# Patient Record
Sex: Male | Born: 1965 | Race: White | Hispanic: No | Marital: Married | Smoking: Never smoker
Health system: Southern US, Community
[De-identification: ages and names within clinical notes are randomized; demographics above are authoritative.]

## PROBLEM LIST (undated history)

## (undated) DIAGNOSIS — G4733 Obstructive sleep apnea (adult) (pediatric): Secondary | ICD-10-CM

## (undated) DIAGNOSIS — R748 Abnormal levels of other serum enzymes: Secondary | ICD-10-CM

## (undated) DIAGNOSIS — K219 Gastro-esophageal reflux disease without esophagitis: Secondary | ICD-10-CM

## (undated) DIAGNOSIS — K654 Sclerosing mesenteritis: Secondary | ICD-10-CM

## (undated) DIAGNOSIS — I2510 Atherosclerotic heart disease of native coronary artery without angina pectoris: Secondary | ICD-10-CM

## (undated) DIAGNOSIS — E291 Testicular hypofunction: Secondary | ICD-10-CM

## (undated) DIAGNOSIS — R079 Chest pain, unspecified: Secondary | ICD-10-CM

## (undated) DIAGNOSIS — E119 Type 2 diabetes mellitus without complications: Secondary | ICD-10-CM

## (undated) DIAGNOSIS — Z9181 History of falling: Secondary | ICD-10-CM

## (undated) DIAGNOSIS — I219 Acute myocardial infarction, unspecified: Secondary | ICD-10-CM

## (undated) DIAGNOSIS — F1011 Alcohol abuse, in remission: Secondary | ICD-10-CM

## (undated) DIAGNOSIS — Z87898 Personal history of other specified conditions: Secondary | ICD-10-CM

## (undated) HISTORY — PX: ANAL FISSURECTOMY: SUR608

## (undated) HISTORY — PX: PILONIDAL CYST / SINUS EXCISION: SUR543

## (undated) HISTORY — PX: APPENDECTOMY: SHX54

---

## 2000-10-17 DIAGNOSIS — Z87898 Personal history of other specified conditions: Secondary | ICD-10-CM

## 2000-10-17 DIAGNOSIS — I219 Acute myocardial infarction, unspecified: Secondary | ICD-10-CM

## 2000-10-17 HISTORY — DX: Acute myocardial infarction, unspecified: I21.9

## 2000-10-17 HISTORY — DX: Personal history of other specified conditions: Z87.898

## 2009-12-15 DIAGNOSIS — Z9181 History of falling: Secondary | ICD-10-CM

## 2009-12-15 HISTORY — DX: History of falling: Z91.81

## 2010-07-16 ENCOUNTER — Ambulatory Visit: Payer: Self-pay | Admitting: Internal Medicine

## 2010-07-27 ENCOUNTER — Ambulatory Visit: Payer: Self-pay | Admitting: Internal Medicine

## 2011-08-18 ENCOUNTER — Ambulatory Visit: Payer: Self-pay | Admitting: Sports Medicine

## 2013-07-29 ENCOUNTER — Emergency Department: Payer: Self-pay | Admitting: Emergency Medicine

## 2013-07-29 LAB — BASIC METABOLIC PANEL
BUN: 13 mg/dL (ref 7–18)
Calcium, Total: 8.8 mg/dL (ref 8.5–10.1)
Chloride: 105 mmol/L (ref 98–107)
Co2: 24 mmol/L (ref 21–32)
Creatinine: 0.78 mg/dL (ref 0.60–1.30)
EGFR (African American): >60
EGFR (Non-African Amer.): >60
Potassium: 3.7 mmol/L (ref 3.5–5.1)

## 2013-07-29 LAB — CBC
HCT: 42.5 % (ref 40.0–52.0)
HGB: 15 g/dL (ref 13.0–18.0)
MCHC: 35.3 g/dL (ref 32.0–36.0)
MCV: 83 fL (ref 80–100)
RBC: 5.14 10*6/uL (ref 4.40–5.90)
RDW: 13.5 % (ref 11.5–14.5)
WBC: 9.5 10*3/uL (ref 3.8–10.6)

## 2013-07-30 ENCOUNTER — Other Ambulatory Visit: Payer: Self-pay

## 2014-08-05 DIAGNOSIS — E119 Type 2 diabetes mellitus without complications: Secondary | ICD-10-CM | POA: Insufficient documentation

## 2014-08-05 DIAGNOSIS — E291 Testicular hypofunction: Secondary | ICD-10-CM | POA: Insufficient documentation

## 2014-08-05 HISTORY — DX: Testicular hypofunction: E29.1

## 2015-02-15 DIAGNOSIS — R079 Chest pain, unspecified: Secondary | ICD-10-CM

## 2015-02-15 HISTORY — DX: Chest pain, unspecified: R07.9

## 2015-02-28 ENCOUNTER — Encounter: Payer: Self-pay | Admitting: *Deleted

## 2015-02-28 ENCOUNTER — Observation Stay
Admission: EM | Admit: 2015-02-28 | Discharge: 2015-03-02 | Disposition: A | Discharge status: Home / Self Care | Payer: Managed Care, Other (non HMO) | Attending: Internal Medicine | Admitting: Internal Medicine

## 2015-02-28 ENCOUNTER — Emergency Department: Payer: Managed Care, Other (non HMO)

## 2015-02-28 DIAGNOSIS — Z79899 Other long term (current) drug therapy: Secondary | ICD-10-CM | POA: Diagnosis not present

## 2015-02-28 DIAGNOSIS — Z87892 Personal history of anaphylaxis: Secondary | ICD-10-CM | POA: Insufficient documentation

## 2015-02-28 DIAGNOSIS — Z6841 Body Mass Index (BMI) 40.0 and over, adult: Secondary | ICD-10-CM | POA: Insufficient documentation

## 2015-02-28 DIAGNOSIS — E669 Obesity, unspecified: Secondary | ICD-10-CM | POA: Diagnosis not present

## 2015-02-28 DIAGNOSIS — Z9889 Other specified postprocedural states: Secondary | ICD-10-CM | POA: Insufficient documentation

## 2015-02-28 DIAGNOSIS — E785 Hyperlipidemia, unspecified: Secondary | ICD-10-CM | POA: Diagnosis not present

## 2015-02-28 DIAGNOSIS — Z880 Allergy status to penicillin: Secondary | ICD-10-CM | POA: Insufficient documentation

## 2015-02-28 DIAGNOSIS — R079 Chest pain, unspecified: Secondary | ICD-10-CM | POA: Diagnosis present

## 2015-02-28 DIAGNOSIS — E119 Type 2 diabetes mellitus without complications: Secondary | ICD-10-CM | POA: Diagnosis not present

## 2015-02-28 DIAGNOSIS — Z833 Family history of diabetes mellitus: Secondary | ICD-10-CM | POA: Diagnosis not present

## 2015-02-28 DIAGNOSIS — I252 Old myocardial infarction: Secondary | ICD-10-CM | POA: Insufficient documentation

## 2015-02-28 DIAGNOSIS — R0602 Shortness of breath: Secondary | ICD-10-CM | POA: Insufficient documentation

## 2015-02-28 DIAGNOSIS — M79602 Pain in left arm: Secondary | ICD-10-CM | POA: Insufficient documentation

## 2015-02-28 DIAGNOSIS — Z8249 Family history of ischemic heart disease and other diseases of the circulatory system: Secondary | ICD-10-CM | POA: Diagnosis not present

## 2015-02-28 DIAGNOSIS — I208 Other forms of angina pectoris: Secondary | ICD-10-CM | POA: Diagnosis present

## 2015-02-28 DIAGNOSIS — G4733 Obstructive sleep apnea (adult) (pediatric): Secondary | ICD-10-CM | POA: Diagnosis not present

## 2015-02-28 DIAGNOSIS — I10 Essential (primary) hypertension: Secondary | ICD-10-CM | POA: Insufficient documentation

## 2015-02-28 DIAGNOSIS — Z7982 Long term (current) use of aspirin: Secondary | ICD-10-CM | POA: Diagnosis not present

## 2015-02-28 HISTORY — DX: Alcohol abuse, in remission: F10.11

## 2015-02-28 HISTORY — DX: Type 2 diabetes mellitus without complications: E11.9

## 2015-02-28 HISTORY — DX: Acute myocardial infarction, unspecified: I21.9

## 2015-02-28 LAB — COMPREHENSIVE METABOLIC PANEL
ALK PHOS: 72 U/L (ref 38–126)
ALT: 94 U/L — ABNORMAL HIGH (ref 17–63)
ANION GAP: 8 (ref 5–15)
AST: 49 U/L — AB (ref 15–41)
Albumin: 4.3 g/dL (ref 3.5–5.0)
BUN: 17 mg/dL (ref 6–20)
CO2: 27 mmol/L (ref 22–32)
CREATININE: 0.9 mg/dL (ref 0.61–1.24)
Calcium: 9.2 mg/dL (ref 8.9–10.3)
Chloride: 104 mmol/L (ref 101–111)
GFR calc Af Amer: >60 mL/min (ref >60)
GFR calc non Af Amer: >60 mL/min (ref >60)
Glucose, Bld: 181 mg/dL — ABNORMAL HIGH (ref 65–99)
POTASSIUM: 3.9 mmol/L (ref 3.5–5.1)
SODIUM: 139 mmol/L (ref 135–145)
TOTAL PROTEIN: 7.2 g/dL (ref 6.5–8.1)
Total Bilirubin: 0.6 mg/dL (ref 0.3–1.2)

## 2015-02-28 LAB — CBC WITH DIFFERENTIAL/PLATELET
BASOS PCT: 1 %
Basophils Absolute: 0.1 10*3/uL (ref 0–0.1)
EOS ABS: 0.2 10*3/uL (ref 0–0.7)
EOS PCT: 3 %
HCT: 42.7 % (ref 40.0–52.0)
Hemoglobin: 14.6 g/dL (ref 13.0–18.0)
Lymphocytes Relative: 26 %
Lymphs Abs: 2.3 10*3/uL (ref 1.0–3.6)
MCH: 28.5 pg (ref 26.0–34.0)
MCHC: 34.3 g/dL (ref 32.0–36.0)
MCV: 83.1 fL (ref 80.0–100.0)
MONO ABS: 0.8 10*3/uL (ref 0.2–1.0)
MONOS PCT: 8 %
NEUTROS PCT: 62 %
Neutro Abs: 5.6 10*3/uL (ref 1.4–6.5)
Platelets: 193 10*3/uL (ref 150–440)
RBC: 5.14 MIL/uL (ref 4.40–5.90)
RDW: 13.6 % (ref 11.5–14.5)
WBC: 8.9 10*3/uL (ref 3.8–10.6)

## 2015-02-28 LAB — TROPONIN I: Troponin I: <0.03 ng/mL (ref <0.031)

## 2015-02-28 NOTE — ED Notes (Signed)
Triage nurse in room with patient and this nurse at this time. Pt reports upper left chest pain radiating to his back and down his left shoulder. Pt reports it feel like his lungs. Pt has clear lung sounds right and clear left lower. Pt has decreased lung sounds upper left lung but air is moving. Pt placed on monitor and pulse ox.

## 2015-02-28 NOTE — ED Provider Notes (Signed)
[facility] Emergency Department Provider Note  ____________________________________________  Time seen: Approximately 11:10 PM  I have reviewed the triage vital signs and the nursing notes.   HISTORY  Chief Complaint Chest Pain and Shortness of Breath    HPI Lance Decker is a 49 y.o. male with history of obesity, OSA, prior myocardial infarction with negative stress test in 2003, diabetes presents for evaluation of exertional dyspnea and chest pain radiating into the left shoulder. Patient reports that last night he was suddenly diaphoretic and felt lightheaded. Today between 7 and 8 PM while at rest he experienced severe chest soreness radiating into the left shoulder, diaphoresis, and difficulty breathing. He reports that this feels similar to his prior myocardial infarction. The pain is not pleuritic in nature, he has no history of PE. Pain does not radiate to his back is not ripping or tearing in nature. Current severity 2 out of 10. He reports symptoms are intermittent. No recent surgeries. No modifying factors. No recent illness including no cough, sneezing, runny nose, congestion. He does have nausea associated with his chest pain and trouble breathing.   Past Medical History  Diagnosis Date  . MI (myocardial infarction)   . Diabetes mellitus without complication   . Sleep apnea   . H/O alcohol abuse     There are no active problems to display for this patient.   Past Surgical History  Procedure Laterality Date  . Pilonidal cyst / sinus excision    . Appendectomy      Current Outpatient Rx  Name  Route  Sig  Dispense  Refill  . aspirin 81 MG tablet   Oral   Take 81 mg by mouth daily.         . metFORMIN (GLUCOPHAGE) 500 MG tablet   Oral   Take 500 mg by mouth 2 (two) times daily with a meal.         . Multiple Vitamin (MULTIVITAMIN) tablet   Oral   Take 1 tablet by mouth daily.           Allergies Penicillins  Family  History  Problem Relation Age of Onset  . Heart disease Father   . Diabetes Mother   . Diabetes Maternal Grandfather   . Diabetes Maternal Grandmother     Social History History  Substance Use Topics  . Smoking status: Never Smoker   . Smokeless tobacco: Current User    Types: Snuff  . Alcohol Use: No    Review of Systems Constitutional: No fever/chills Eyes: No visual changes. ENT: No sore throat. Cardiovascular: + chest pain. Respiratory: + shortness of breath. Gastrointestinal: No abdominal pain.  + nausea, no vomiting.  No diarrhea.  No constipation. Genitourinary: Negative for dysuria. Musculoskeletal: Negative for back pain. Skin: Negative for rash. Neurological: Negative for headaches, focal weakness or numbness.  10-point ROS otherwise negative.  ____________________________________________   PHYSICAL EXAM:  VITAL SIGNS: ED Triage Vitals  Enc Vitals Group     BP 02/28/15 2255 141/85 mmHg     Pulse Rate 02/28/15 2255 80     Resp 02/28/15 2255 24     Temp 02/28/15 2255 98.1 F (36.7 C)     Temp Source 02/28/15 2255 Oral     SpO2 02/28/15 2255 99 %     Weight 02/28/15 2255 350 lb (158.759 kg)     Height 02/28/15 2255 6' (1.829 m)     Head Cir --      Peak Flow --  Pain Score 02/28/15 2258 5     Pain Loc --      Pain Edu? --      Excl. in [place]? --     Constitutional: Alert and oriented. Well appearing and in no acute distress. Eyes: Conjunctivae are normal. PERRL. EOMI. Head: Atraumatic. Nose: No congestion/rhinnorhea. Mouth/Throat: Mucous membranes are moist.  Oropharynx non-erythematous. Neck: No stridor.  Hematological/Lymphatic/Immunilogical: No cervical lymphadenopathy. Cardiovascular: Normal rate, regular rhythm. Grossly normal heart sounds.  Good peripheral circulation. Respiratory: Normal respiratory effort.  No retractions. Lungs CTAB. Gastrointestinal: Soft and nontender. No distention. No abdominal bruits. No CVA  tenderness. Genitourinary: deferred Musculoskeletal: No lower extremity tenderness nor edema.  No joint effusions. Neurologic:  Normal speech and language. No gross focal neurologic deficits are appreciated. Speech is normal. No gait instability. Skin:  Skin is warm, dry and intact. No rash noted. Psychiatric: Mood and affect are normal. Speech and behavior are normal.  ____________________________________________   LABS (all labs ordered are listed, but only abnormal results are displayed)  Labs Reviewed  COMPREHENSIVE METABOLIC PANEL - Abnormal; Notable for the following:    Glucose, Bld 181 (*)    AST 49 (*)    ALT 94 (*)    All other components within normal limits  CBC WITH DIFFERENTIAL/PLATELET  TROPONIN I   ____________________________________________  EKG  ED ECG REPORT   Date: 03/01/2015  EKG Time: 23:00  Rate: 80  Rhythm: normal EKG, normal sinus rhythm  Axis: Normal  Intervals:none  ST&T Change: None  ____________________________________________  RADIOLOGY   CXR: IMPRESSION: Mild vascular congestion noted; lungs remain grossly clear.  ____________________________________________   PROCEDURES  Procedure(s) performed: None  Critical Care performed: No  ____________________________________________   INITIAL IMPRESSION / ASSESSMENT AND PLAN / ED COURSE  Pertinent labs & imaging results that were available during my care of the patient were reviewed by me and considered in my medical decision making (see chart for details).  Lance Decker is a 49 y.o. male with history of obesity, OSA, prior myocardial infarction with negative stress test in 2003, diabetes presents for evaluation of exertional dyspnea and chest pain radiating into the left shoulder. On exam he does not appear to be in any acute distress but he does intermittently develop some pain in the chest that is mild. By then end of the examination the pain had resolved. EKG is reassuring  and first troponin is negative however given his prior history of MI without provocative testing for 13 years, use of daily aspirin, symptom similarities to prior MI, discussed with hospitalist for admission given concern for ACS. Doubt PE or acute aortic dissection. ____________________________________________   FINAL CLINICAL IMPRESSION(S) / ED DIAGNOSES  Final diagnoses:  Chest pain, unspecified chest pain type      Joanne Gavel, MD 03/01/15 0040

## 2015-02-28 NOTE — ED Notes (Signed)
Pt presents w/ c/o chest pain and dyspnea starting x 45 minutes ago. Pt has hx of MI. Pt is acutely dyspneic, pale, and in pain.

## 2015-03-01 ENCOUNTER — Encounter: Payer: Self-pay | Admitting: Internal Medicine

## 2015-03-01 ENCOUNTER — Observation Stay
Admit: 2015-03-01 | Discharge: 2015-03-01 | Disposition: A | Discharge status: Home / Self Care | Payer: Managed Care, Other (non HMO) | Attending: Internal Medicine | Admitting: Internal Medicine

## 2015-03-01 DIAGNOSIS — E119 Type 2 diabetes mellitus without complications: Secondary | ICD-10-CM

## 2015-03-01 DIAGNOSIS — I208 Other forms of angina pectoris: Secondary | ICD-10-CM | POA: Diagnosis present

## 2015-03-01 DIAGNOSIS — G4733 Obstructive sleep apnea (adult) (pediatric): Secondary | ICD-10-CM | POA: Diagnosis present

## 2015-03-01 LAB — BASIC METABOLIC PANEL
Anion gap: 8 (ref 5–15)
BUN: 18 mg/dL (ref 6–20)
CALCIUM: 8.8 mg/dL — AB (ref 8.9–10.3)
CO2: 25 mmol/L (ref 22–32)
Chloride: 107 mmol/L (ref 101–111)
Creatinine, Ser: 0.69 mg/dL (ref 0.61–1.24)
GFR calc Af Amer: >60 mL/min (ref >60)
GFR calc non Af Amer: >60 mL/min (ref >60)
Glucose, Bld: 139 mg/dL — ABNORMAL HIGH (ref 65–99)
Potassium: 4 mmol/L (ref 3.5–5.1)
SODIUM: 140 mmol/L (ref 135–145)

## 2015-03-01 LAB — GLUCOSE, CAPILLARY
GLUCOSE-CAPILLARY: 138 mg/dL — AB (ref 65–99)
GLUCOSE-CAPILLARY: 147 mg/dL — AB (ref 65–99)
GLUCOSE-CAPILLARY: 166 mg/dL — AB (ref 65–99)
Glucose-Capillary: 115 mg/dL — ABNORMAL HIGH (ref 65–99)
Glucose-Capillary: 140 mg/dL — ABNORMAL HIGH (ref 65–99)

## 2015-03-01 LAB — CBC
HEMATOCRIT: 41.7 % (ref 40.0–52.0)
Hemoglobin: 14.3 g/dL (ref 13.0–18.0)
MCH: 28.5 pg (ref 26.0–34.0)
MCHC: 34.3 g/dL (ref 32.0–36.0)
MCV: 83.2 fL (ref 80.0–100.0)
PLATELETS: 183 10*3/uL (ref 150–440)
RBC: 5.01 MIL/uL (ref 4.40–5.90)
RDW: 13.4 % (ref 11.5–14.5)
WBC: 8.5 10*3/uL (ref 3.8–10.6)

## 2015-03-01 LAB — BRAIN NATRIURETIC PEPTIDE: B Natriuretic Peptide: 10 pg/mL (ref 0.0–100.0)

## 2015-03-01 MED ORDER — SODIUM CHLORIDE 0.9 % WEIGHT BASED INFUSION
1.0000 mL/kg/h | INTRAVENOUS | Status: DC
  Administered 2015-03-01 – 2015-03-02 (×2): 1 mL/kg/h via INTRAVENOUS

## 2015-03-01 MED ORDER — SODIUM CHLORIDE 0.9 % IV SOLN: 250.0000 mL | INTRAVENOUS | Status: DC | PRN

## 2015-03-01 MED ORDER — SODIUM CHLORIDE 0.9 % IJ SOLN
3.0000 mL | Freq: Two times a day (BID) | INTRAMUSCULAR | Status: DC
  Administered 2015-03-01 (×2): 3 mL via INTRAVENOUS

## 2015-03-01 MED ORDER — ASPIRIN 81 MG PO CHEW
CHEWABLE_TABLET | ORAL | Status: AC
  Administered 2015-03-01: 162 mg via ORAL
  Filled 2015-03-01: qty 2

## 2015-03-01 MED ORDER — IBUPROFEN 100 MG/5ML PO SUSP
ORAL | Status: AC
  Filled 2015-03-01: qty 10

## 2015-03-01 MED ORDER — ASPIRIN EC 81 MG PO TBEC
81.0000 mg | DELAYED_RELEASE_TABLET | Freq: Every day | ORAL | Status: DC
  Administered 2015-03-01 – 2015-03-02 (×2): 81 mg via ORAL
  Filled 2015-03-01 (×3): qty 1

## 2015-03-01 MED ORDER — ENOXAPARIN SODIUM 40 MG/0.4ML ~~LOC~~ SOLN
40.0000 mg | Freq: Two times a day (BID) | SUBCUTANEOUS | Status: DC
  Administered 2015-03-01: 40 mg via SUBCUTANEOUS
  Filled 2015-03-01: qty 0.4

## 2015-03-01 MED ORDER — SODIUM CHLORIDE 0.9 % IJ SOLN: 3.0000 mL | INTRAMUSCULAR | Status: DC | PRN

## 2015-03-01 MED ORDER — ACETAMINOPHEN 325 MG PO TABS: 650.0000 mg | ORAL_TABLET | Freq: Four times a day (QID) | ORAL | Status: DC | PRN

## 2015-03-01 MED ORDER — ENOXAPARIN SODIUM 40 MG/0.4ML ~~LOC~~ SOLN
40.0000 mg | Freq: Two times a day (BID) | SUBCUTANEOUS | Status: AC
  Administered 2015-03-01: 40 mg via SUBCUTANEOUS
  Filled 2015-03-01: qty 0.4

## 2015-03-01 MED ORDER — INSULIN ASPART 100 UNIT/ML ~~LOC~~ SOLN
0.0000 [IU] | Freq: Three times a day (TID) | SUBCUTANEOUS | Status: DC
Start: 2015-03-01 — End: 2015-03-02

## 2015-03-01 MED ORDER — NITROGLYCERIN 0.4 MG SL SUBL: 0.4000 mg | SUBLINGUAL_TABLET | SUBLINGUAL | Status: DC | PRN

## 2015-03-01 MED ORDER — ASPIRIN 81 MG PO CHEW
162.0000 mg | CHEWABLE_TABLET | Freq: Once | ORAL | Status: AC
  Administered 2015-03-01: 162 mg via ORAL

## 2015-03-01 MED ORDER — SODIUM CHLORIDE 0.9 % IJ SOLN: 3.0000 mL | Freq: Two times a day (BID) | INTRAMUSCULAR | Status: DC

## 2015-03-01 MED ORDER — INSULIN ASPART 100 UNIT/ML ~~LOC~~ SOLN: 0.0000 [IU] | Freq: Four times a day (QID) | SUBCUTANEOUS | Status: DC

## 2015-03-01 MED ORDER — ACETAMINOPHEN 650 MG RE SUPP: 650.0000 mg | Freq: Four times a day (QID) | RECTAL | Status: DC | PRN

## 2015-03-01 NOTE — Progress Notes (Signed)
Pt alertx4. Independent in the room. VSS. Complains of discomfort in chest but no pain. Will monitor. Admission completed. Wife at beside. Bipap at night. Pt in bed. Will continue to monitor,

## 2015-03-01 NOTE — ED Notes (Signed)
Pt provided with warm blankets.

## 2015-03-01 NOTE — Progress Notes (Signed)
PROGRESS NOTE  Lance Decker EGB:[id number] DOB: 1966/08/20 DOA: 02/28/2015 PCP: No primary care provider on file.   49 y/o male with hx of Type 2 DM, OSA, CAD- previous MI, Obesity, Tobacco use disorder admitted with chest pain consistent with unstable angina. Pt states he had tingling in left arm and diaphoresis  Troponin < 0.03 This am chest pain free   Consultants:  Cardiology: Dr. Florene Route   Objective: BP 127/70 mmHg  Pulse 64  Temp(Src) 97.8 F (36.6 C) (Oral)  Resp 20  Ht 6' (1.829 m)  Wt 162.4 kg (358 lb 0.4 oz)  BMI 48.55 kg/m2  SpO2 100%  Intake/Output Summary (Last 24 hours) at 03/01/15 1132 Last data filed at 03/01/15 0800  Gross per 24 hour  Intake      0 ml  Output    175 ml  Net   -175 ml   Filed Weights   02/28/15 2255 03/01/15 0500  Weight: 158.759 kg (350 lb) 162.4 kg (358 lb 0.4 oz)    Exam:   General:  NAD. Obese.Appears comfortable   Cardiovascular: S1 S2  Respiratory: Clear to auscultation  Abdomen: Soft. Obese. Non tender  Neuro:Non Focal  Data Reviewed: Basic Metabolic Panel:  Recent Labs Lab 02/28/15 2300 03/01/15 0327  NA 139 140  K 3.9 4.0  CL 104 107  CO2 27 25  GLUCOSE 181* 139*  BUN 17 18  CREATININE 0.90 0.69  CALCIUM 9.2 8.8*   Liver Function Tests:  Recent Labs Lab 02/28/15 2300  AST 49*  ALT 94*  ALKPHOS 72  BILITOT 0.6  PROT 7.2  ALBUMIN 4.3   No results for input(s): LIPASE, AMYLASE in the last 168 hours. No results for input(s): AMMONIA in the last 168 hours. CBC:  Recent Labs Lab 02/28/15 2300 03/01/15 0327  WBC 8.9 8.5  NEUTROABS 5.6  --   HGB 14.6 14.3  HCT 42.7 41.7  MCV 83.1 83.2  PLT 193 183   Cardiac Enzymes:    Recent Labs Lab 02/28/15 2300  TROPONINI <0.03   BNP (last 3 results)  Recent Labs  03/01/15 0327  BNP 10.0    ProBNP (last 3 results) No results for input(s): PROBNP in the last 8760 hours.  CBG:  Recent Labs Lab 03/01/15 0601 03/01/15 0745   GLUCAP 138* 147*    No results found for this or any previous visit (from the past 240 hour(s)).   Studies: Dg Chest Portable 1 View  02/28/2015   CLINICAL DATA:  Acute onset of chest pain and shortness of breath. Initial encounter.  EXAM: PORTABLE CHEST - 1 VIEW  COMPARISON:  None.  FINDINGS: The lungs are well-aerated. Mild vascular congestion is noted. There is no evidence of focal opacification, pleural effusion or pneumothorax.  The cardiomediastinal silhouette is within normal limits. No acute osseous abnormalities are seen.  IMPRESSION: Mild vascular congestion noted; lungs remain grossly clear.   Electronically Signed   By: Garald Balding M.D.   On: 02/28/2015 23:29    Scheduled Meds: . aspirin EC  81 mg Oral Daily  . enoxaparin (LOVENOX) injection  40 mg Subcutaneous Q12H  . insulin aspart  0-9 Units Subcutaneous Q6H  . sodium chloride  3 mL Intravenous Q12H    Continuous Infusions:   Assessment/Plan: 1 Chest Pain consistent with unstable angina: On Aspirin, prn NTG  and Lovenox Strongly advised tobacco cessation For Cardiac cath in am D/w Dr. Clayborn Bigness 2 Type 2 DM: Currently held- On SSI 3  OSA: On CPAP 4 Abnormal LFTS; Probable fatty liver although pt does give hx of alcohol use        Lance Decker   03/01/2015, 11:32 AM

## 2015-03-01 NOTE — Consult Note (Signed)
Reason for Consult:Unstable angina/Chest apin Referring Physician: Dr Ginette Pitman Primary Doctor Dr Irving Shows is an 49 y.o. male.  HPI: Pt is a 49 y/o WM with a history of recent angina while at home. He c/o left sided chest discomfort. Marland Kitchen He c/o of sob left arm pain and pressure.He has a history of DM and is on metformin. He reports a possible MI about 15  years ago. He had a nuclear study which reportedly was ok. He has been treated with asa and had done well until now..  Past Medical History  Diagnosis Date  . MI (myocardial infarction)   . Diabetes mellitus without complication   . Sleep apnea   . H/O alcohol abuse     none since 1992    Past Surgical History  Procedure Laterality Date  . Pilonidal cyst / sinus excision    . Appendectomy      Family History  Problem Relation Age of Onset  . Heart disease Father   . Diabetes Mother   . Diabetes Maternal Grandfather   . Diabetes Maternal Grandmother     Social History:  reports that he has never smoked. His smokeless tobacco use includes Snuff. He reports that he does not drink alcohol or use illicit drugs.  Allergies:  Allergies  Allergen Reactions  . Penicillins Anaphylaxis    Medications:  Prior to Admission:  Prescriptions prior to admission  Medication Sig Dispense Refill Last Dose  . aspirin 81 MG tablet Take 81 mg by mouth daily.     . metFORMIN (GLUCOPHAGE) 500 MG tablet Take 500 mg by mouth 2 (two) times daily with a meal.     . Multiple Vitamin (MULTIVITAMIN) tablet Take 1 tablet by mouth daily.       Results for orders placed or performed during the hospital encounter of 02/28/15 (from the past 48 hour(s))  CBC with Differential     Status: None   Collection Time: 02/28/15 11:00 PM  Result Value Ref Range   WBC 8.9 3.8 - 10.6 K/uL   RBC 5.14 4.40 - 5.90 MIL/uL   Hemoglobin 14.6 13.0 - 18.0 g/dL   HCT 42.7 40.0 - 52.0 %   MCV 83.1 80.0 - 100.0 fL   MCH 28.5 26.0 - 34.0 pg   MCHC 34.3 32.0 -  36.0 g/dL   RDW 13.6 11.5 - 14.5 %   Platelets 193 150 - 440 K/uL   Neutrophils Relative % 62 %   Neutro Abs 5.6 1.4 - 6.5 K/uL   Lymphocytes Relative 26 %   Lymphs Abs 2.3 1.0 - 3.6 K/uL   Monocytes Relative 8 %   Monocytes Absolute 0.8 0.2 - 1.0 K/uL   Eosinophils Relative 3 %   Eosinophils Absolute 0.2 0 - 0.7 K/uL   Basophils Relative 1 %   Basophils Absolute 0.1 0 - 0.1 K/uL  Comprehensive metabolic panel     Status: Abnormal   Collection Time: 02/28/15 11:00 PM  Result Value Ref Range   Sodium 139 135 - 145 mmol/L   Potassium 3.9 3.5 - 5.1 mmol/L   Chloride 104 101 - 111 mmol/L   CO2 27 22 - 32 mmol/L   Glucose, Bld 181 (H) 65 - 99 mg/dL   BUN 17 6 - 20 mg/dL   Creatinine, Ser 0.90 0.61 - 1.24 mg/dL   Calcium 9.2 8.9 - 10.3 mg/dL   Total Protein 7.2 6.5 - 8.1 g/dL   Albumin 4.3 3.5 - 5.0 g/dL  AST 49 (H) 15 - 41 U/L   ALT 94 (H) 17 - 63 U/L   Alkaline Phosphatase 72 38 - 126 U/L   Total Bilirubin 0.6 0.3 - 1.2 mg/dL   GFR calc non Af Amer >60 >60 mL/min   GFR calc Af Amer >60 >60 mL/min    Comment: (NOTE) The eGFR has been calculated using the CKD EPI equation. This calculation has not been validated in all clinical situations. eGFR's persistently <60 mL/min signify possible Chronic Kidney Disease.    Anion gap 8 5 - 15  Troponin I     Status: None   Collection Time: 02/28/15 11:00 PM  Result Value Ref Range   Troponin I <0.03 <0.031 ng/mL    Comment:        NO INDICATION OF MYOCARDIAL INJURY.   Basic metabolic panel     Status: Abnormal   Collection Time: 03/01/15  3:27 AM  Result Value Ref Range   Sodium 140 135 - 145 mmol/L   Potassium 4.0 3.5 - 5.1 mmol/L   Chloride 107 101 - 111 mmol/L   CO2 25 22 - 32 mmol/L   Glucose, Bld 139 (H) 65 - 99 mg/dL   BUN 18 6 - 20 mg/dL   Creatinine, Ser 0.69 0.61 - 1.24 mg/dL   Calcium 8.8 (L) 8.9 - 10.3 mg/dL   GFR calc non Af Amer >60 >60 mL/min   GFR calc Af Amer >60 >60 mL/min    Comment: (NOTE) The eGFR has  been calculated using the CKD EPI equation. This calculation has not been validated in all clinical situations. eGFR's persistently <60 mL/min signify possible Chronic Kidney Disease.    Anion gap 8 5 - 15  CBC     Status: None   Collection Time: 03/01/15  3:27 AM  Result Value Ref Range   WBC 8.5 3.8 - 10.6 K/uL   RBC 5.01 4.40 - 5.90 MIL/uL   Hemoglobin 14.3 13.0 - 18.0 g/dL   HCT 41.7 40.0 - 52.0 %   MCV 83.2 80.0 - 100.0 fL   MCH 28.5 26.0 - 34.0 pg   MCHC 34.3 32.0 - 36.0 g/dL   RDW 13.4 11.5 - 14.5 %   Platelets 183 150 - 440 K/uL  Brain natriuretic peptide     Status: None   Collection Time: 03/01/15  3:27 AM  Result Value Ref Range   B Natriuretic Peptide 10.0 0.0 - 100.0 pg/mL  Glucose, capillary     Status: Abnormal   Collection Time: 03/01/15  6:01 AM  Result Value Ref Range   Glucose-Capillary 138 (H) 65 - 99 mg/dL  Glucose, capillary     Status: Abnormal   Collection Time: 03/01/15  7:45 AM  Result Value Ref Range   Glucose-Capillary 147 (H) 65 - 99 mg/dL  Glucose, capillary     Status: Abnormal   Collection Time: 03/01/15 12:19 PM  Result Value Ref Range   Glucose-Capillary 166 (H) 65 - 99 mg/dL  Glucose, capillary     Status: Abnormal   Collection Time: 03/01/15  5:26 PM  Result Value Ref Range   Glucose-Capillary 115 (H) 65 - 99 mg/dL  Glucose, capillary     Status: Abnormal   Collection Time: 03/01/15  8:04 PM  Result Value Ref Range   Glucose-Capillary 140 (H) 65 - 99 mg/dL    Dg Chest Portable 1 View  02/28/2015   CLINICAL DATA:  Acute onset of chest pain and shortness of breath. Initial  encounter.  EXAM: PORTABLE CHEST - 1 VIEW  COMPARISON:  None.  FINDINGS: The lungs are well-aerated. Mild vascular congestion is noted. There is no evidence of focal opacification, pleural effusion or pneumothorax.  The cardiomediastinal silhouette is within normal limits. No acute osseous abnormalities are seen.  IMPRESSION: Mild vascular congestion noted; lungs remain  grossly clear.   Electronically Signed   By: Garald Balding M.D.   On: 02/28/2015 23:29    Review of Systems  Constitutional: Negative.   HENT: Negative.   Eyes: Negative.   Respiratory: Negative.   Cardiovascular: Positive for chest pain.  Gastrointestinal: Negative.   Genitourinary: Negative.   Musculoskeletal: Negative.   Skin: Negative.   Neurological: Negative.   Endo/Heme/Allergies: Negative.   Psychiatric/Behavioral: Negative.    Blood pressure 122/58, pulse 66, temperature 97.7 F (36.5 C), temperature source Oral, resp. rate 20, height 6' (1.829 m), weight 162.4 kg (358 lb 0.4 oz), SpO2 97 %. Physical Exam  Constitutional: He appears well-developed and well-nourished.  HENT:  Head: Normocephalic.  Eyes: Conjunctivae are normal. Pupils are equal, round, and reactive to light.  Neck: Normal range of motion. Neck supple.  Cardiovascular: Normal rate.   GI: Soft.  Musculoskeletal: Normal range of motion.  Neurological: He is alert.  Skin: Skin is warm.    Assessment/Plan: [place] CP DM HTN Obesity OSA? Hyperlipidemia . PLAN ROMI Tele F/U Ekg and enzymes ASA 81 mg po qd B-blockers Consider ACE for Bp NTG prn for angina Consider ECHO Consider sleep study Agree with DM therapy but hold metformin for now Recommend weight loss Consider statin for lipids Cardiac cath in am  CALLWOOD,DWAYNE D. 03/01/2015, 10:38 PM

## 2015-03-01 NOTE — H&P (Signed)
[facility] [place] NAME: Joedy Eickhoff    MR#:  [id number]  DATE OF BIRTH:  13-Nov-1965  DATE OF ADMISSION:  02/28/2015  PRIMARY CARE PHYSICIAN: Adin Hector, MD   REQUESTING/REFERRING PHYSICIAN: Edd Fabian  CHIEF COMPLAINT:   Chief Complaint  Patient presents with  . Chest Pain  . Shortness of Breath    HISTORY OF PRESENT ILLNESS:  Lance Decker  is a 49 y.o. male who presents with 1-1/2 days of intermittent chest pain. Patient has a history of diabetes with prior MI, and states that he has been having intermittent chest pain with radiation to his left arm accompanied by diaphoresis. He states that this chest pain is not exclusively exertional in nature, is not alleviated by rest as it oftentimes starts when he is at rest, and was not alleviated by aspirin. Upon interview by this writer, patient is no longer experiencing chest pain. He states that the pain is not exactly equivalent to the pain he had when he had his prior heart attack. He states that he never had a cardiac catheter done at the time of his prior MI, and is unable to give an exact history but states that he had a nuclear stress test and then was told that his heart attack was due to stress at work and life stressors. Patient also adds "but that was several years ago and 75 pounds lighter". Upon evaluation in the ED chest x-ray showed mild vascular congestion, sore set of cardiac enzymes was negative. Hospitalists were called for ACS rule out. Of note the patient states that he just finished a seven-day course of doxycycline for pilonidal cyst. He denies any other infectious etiology, see full review of systems below.  PAST MEDICAL HISTORY:   Past Medical History  Diagnosis Date  . MI (myocardial infarction)   . Diabetes mellitus without complication   . Sleep apnea   . H/O alcohol abuse     none since 1992    PAST SURGICAL HISTORY:   Past Surgical History   Procedure Laterality Date  . Pilonidal cyst / sinus excision    . Appendectomy      SOCIAL HISTORY:   History  Substance Use Topics  . Smoking status: Never Smoker   . Smokeless tobacco: Current User    Types: Snuff  . Alcohol Use: No    FAMILY HISTORY:   Family History  Problem Relation Age of Onset  . Heart disease Father   . Diabetes Mother   . Diabetes Maternal Grandfather   . Diabetes Maternal Grandmother     DRUG ALLERGIES:   Allergies  Allergen Reactions  . Penicillins Anaphylaxis    MEDICATIONS AT HOME:   Prior to Admission medications   Medication Sig Start Date End Date Taking? Authorizing Provider  aspirin 81 MG tablet Take 81 mg by mouth daily.   Yes Historical Provider, MD  metFORMIN (GLUCOPHAGE) 500 MG tablet Take 500 mg by mouth 2 (two) times daily with a meal.   Yes Historical Provider, MD  Multiple Vitamin (MULTIVITAMIN) tablet Take 1 tablet by mouth daily.   Yes Historical Provider, MD    REVIEW OF SYSTEMS:  Review of Systems  Constitutional: Negative for fever, chills, weight loss and malaise/fatigue.  HENT: Negative for ear pain, hearing loss and tinnitus.   Eyes: Negative for blurred vision, double vision, pain and redness.  Respiratory: Positive for shortness of breath. Negative for cough and hemoptysis.  Cardiovascular: Positive for chest pain (with radiation to left arm). Negative for palpitations, orthopnea and leg swelling.       Diaphoresis associated with chest pain  Gastrointestinal: Negative for nausea, vomiting, abdominal pain, diarrhea and constipation.  Genitourinary: Negative for dysuria, frequency and hematuria.  Musculoskeletal: Negative for back pain, joint pain and neck pain.  Skin:       No acne, rash, or lesions  Neurological: Negative for dizziness, tremors, focal weakness and weakness.  Endo/Heme/Allergies: Negative for polydipsia. Does not bruise/bleed easily.  Psychiatric/Behavioral: Negative for depression. The  patient is not nervous/anxious and does not have insomnia.      VITAL SIGNS:   Filed Vitals:   02/28/15 2255  BP: 141/85  Pulse: 80  Temp: 98.1 F (36.7 C)  TempSrc: Oral  Resp: 24  Height: 6' (1.829 m)  Weight: 158.759 kg (350 lb)  SpO2: 99%   Wt Readings from Last 3 Encounters:  02/28/15 158.759 kg (350 lb)    PHYSICAL EXAMINATION:  Physical Exam  Constitutional: He is oriented to person, place, and time. He appears well-developed and well-nourished. No distress.  HENT:  Head: Normocephalic and atraumatic.  Mouth/Throat: Oropharynx is clear and moist.  Eyes: Conjunctivae and EOM are normal. Pupils are equal, round, and reactive to light. No scleral icterus.  Neck: Normal range of motion. Neck supple. No JVD present. No thyromegaly present.  Cardiovascular: Normal rate, regular rhythm and intact distal pulses.  Exam reveals no gallop and no friction rub.   No murmur heard. Respiratory: Effort normal and breath sounds normal. No respiratory distress. He has no wheezes. He has no rales.  GI: Soft. Bowel sounds are normal. He exhibits no distension. There is no tenderness.  Musculoskeletal: Normal range of motion. He exhibits no edema.  No arthritis, no gout  Lymphadenopathy:    He has no cervical adenopathy.  Neurological: He is alert and oriented to person, place, and time. No cranial nerve deficit.  No dysarthria, no aphasia  Skin: Skin is warm and dry. No rash noted. No erythema.  Psychiatric: He has a normal mood and affect. His behavior is normal. Judgment and thought content normal.    LABORATORY PANEL:   CBC  Recent Labs Lab 02/28/15 2300  WBC 8.9  HGB 14.6  HCT 42.7  PLT 193   ------------------------------------------------------------------------------------------------------------------  Chemistries   Recent Labs Lab 02/28/15 2300  NA 139  K 3.9  CL 104  CO2 27  GLUCOSE 181*  BUN 17  CREATININE 0.90  CALCIUM 9.2  AST 49*  ALT 94*   ALKPHOS 72  BILITOT 0.6   ------------------------------------------------------------------------------------------------------------------  Cardiac Enzymes  Recent Labs Lab 02/28/15 2300  TROPONINI <0.03   ------------------------------------------------------------------------------------------------------------------  RADIOLOGY:  Dg Chest Portable 1 View  02/28/2015   CLINICAL DATA:  Acute onset of chest pain and shortness of breath. Initial encounter.  EXAM: PORTABLE CHEST - 1 VIEW  COMPARISON:  None.  FINDINGS: The lungs are well-aerated. Mild vascular congestion is noted. There is no evidence of focal opacification, pleural effusion or pneumothorax.  The cardiomediastinal silhouette is within normal limits. No acute osseous abnormalities are seen.  IMPRESSION: Mild vascular congestion noted; lungs remain grossly clear.   Electronically Signed   By: Garald Balding M.D.   On: 02/28/2015 23:29    EKG:  No orders found for this or any previous visit.  IMPRESSION AND PLAN:  Principal Problem:   Angina at rest - patient's symptoms are consistent with true cardiac cause for his  pain. We'll admit him to observation, trend cardiac enzymes, order echocardiogram and cardiology consult. Given his mild vascular congestion on chest x-ray in the ED, and his complaint of mild shortness of breath, we will check a BNP. Active Problems:   OSA treated with BiPAP - chronic stable problem, patient uses BiPAP at home and wishes to use his home machine. Ordered the same.   Diabetes mellitus, type 2 - on metformin at home. We will continue this medication here once the patient is eating, he will be kept nothing by mouth for right now for possible cardiac procedure.    All the records are reviewed and case discussed with ED provider. Management plans discussed with the patient and/or family.  DVT PROPHYLAXIS: SubQ lovenox  ADMISSION STATUS: Observation  CODE STATUS: Full  TOTAL TIME TAKING  CARE OF THIS PATIENT: 45 minutes.    Athony Coppa [place] 03/01/2015, 1:09 AM  [facility]  Office  [phone]  CC: Primary care physician; Adin Hector, MD

## 2015-03-01 NOTE — Progress Notes (Signed)
Alert and oriented. No complaints of pain. Vitals stable. On room air. Sinus rhythm on tele. Dr. Clayborn Bigness will perform cardiac catheterization tomorrow. Consent has been signed and education completed. Patient has no further questions at this time. Will continue to monitor.

## 2015-03-02 ENCOUNTER — Encounter: Payer: Self-pay | Admitting: *Deleted

## 2015-03-02 ENCOUNTER — Encounter
Admission: EM | Disposition: A | Discharge status: Home / Self Care | Payer: Managed Care, Other (non HMO) | Source: Home / Self Care | Attending: Student

## 2015-03-02 DIAGNOSIS — R079 Chest pain, unspecified: Secondary | ICD-10-CM | POA: Diagnosis present

## 2015-03-02 HISTORY — PX: CARDIAC CATHETERIZATION: SHX172

## 2015-03-02 LAB — BASIC METABOLIC PANEL
Anion gap: 6 (ref 5–15)
BUN: 18 mg/dL (ref 6–20)
CALCIUM: 8.8 mg/dL — AB (ref 8.9–10.3)
CHLORIDE: 105 mmol/L (ref 101–111)
CO2: 28 mmol/L (ref 22–32)
CREATININE: 0.84 mg/dL (ref 0.61–1.24)
GFR calc non Af Amer: >60 mL/min (ref >60)
Glucose, Bld: 144 mg/dL — ABNORMAL HIGH (ref 65–99)
Potassium: 4 mmol/L (ref 3.5–5.1)
Sodium: 139 mmol/L (ref 135–145)

## 2015-03-02 LAB — GLUCOSE, CAPILLARY
GLUCOSE-CAPILLARY: 135 mg/dL — AB (ref 65–99)
Glucose-Capillary: 133 mg/dL — ABNORMAL HIGH (ref 65–99)
Glucose-Capillary: 110 mg/dL — ABNORMAL HIGH (ref 65–99)

## 2015-03-02 LAB — CBC WITH DIFFERENTIAL/PLATELET
Basophils Absolute: 0.1 10*3/uL (ref 0–0.1)
Basophils Relative: 1 %
Eosinophils Absolute: 0.3 10*3/uL (ref 0–0.7)
Eosinophils Relative: 5 %
HEMATOCRIT: 42.2 % (ref 40.0–52.0)
Hemoglobin: 14.3 g/dL (ref 13.0–18.0)
Lymphocytes Relative: 26 %
Lymphs Abs: 1.9 10*3/uL (ref 1.0–3.6)
MCH: 28.4 pg (ref 26.0–34.0)
MCHC: 33.9 g/dL (ref 32.0–36.0)
MCV: 83.8 fL (ref 80.0–100.0)
MONOS PCT: 9 %
Monocytes Absolute: 0.6 10*3/uL (ref 0.2–1.0)
NEUTROS PCT: 59 %
Neutro Abs: 4.4 10*3/uL (ref 1.4–6.5)
PLATELETS: 175 10*3/uL (ref 150–440)
RBC: 5.04 MIL/uL (ref 4.40–5.90)
RDW: 13.7 % (ref 11.5–14.5)
WBC: 7.4 10*3/uL (ref 3.8–10.6)

## 2015-03-02 SURGERY — LEFT HEART CATH AND CORONARY ANGIOGRAPHY
Anesthesia: Moderate Sedation | Laterality: Left

## 2015-03-02 MED ORDER — SODIUM CHLORIDE 0.9 % IJ SOLN: 3.0000 mL | INTRAMUSCULAR | Status: DC | PRN

## 2015-03-02 MED ORDER — MIDAZOLAM HCL 2 MG/2ML IJ SOLN
INTRAMUSCULAR | Status: AC
  Filled 2015-03-02: qty 2

## 2015-03-02 MED ORDER — SODIUM CHLORIDE 0.9 % IJ SOLN: 3.0000 mL | Freq: Two times a day (BID) | INTRAMUSCULAR | Status: DC

## 2015-03-02 MED ORDER — MIDAZOLAM HCL 2 MG/2ML IJ SOLN
INTRAMUSCULAR | Status: DC | PRN
  Administered 2015-03-02 (×3): 1 mg via INTRAVENOUS

## 2015-03-02 MED ORDER — SODIUM CHLORIDE 0.9 % IV SOLN: 250.0000 mL | INTRAVENOUS | Status: DC | PRN

## 2015-03-02 MED ORDER — INSULIN ASPART 100 UNIT/ML ~~LOC~~ SOLN: 0.0000 [IU] | Freq: Three times a day (TID) | SUBCUTANEOUS | Status: DC

## 2015-03-02 MED ORDER — SODIUM CHLORIDE 0.9 % IV SOLN
INTRAVENOUS | Status: AC
Start: 2015-03-02 — End: 2015-03-02

## 2015-03-02 MED ORDER — ACETAMINOPHEN 325 MG PO TABS: 650.0000 mg | ORAL_TABLET | ORAL | Status: DC | PRN

## 2015-03-02 MED ORDER — HEPARIN (PORCINE) IN NACL 2-0.9 UNIT/ML-% IJ SOLN
INTRAMUSCULAR | Status: AC
  Filled 2015-03-02: qty 1000

## 2015-03-02 MED ORDER — OMEPRAZOLE 20 MG PO CPDR: 20.0000 mg | DELAYED_RELEASE_CAPSULE | Freq: Two times a day (BID) | ORAL | Status: AC

## 2015-03-02 MED ORDER — FENTANYL CITRATE (PF) 100 MCG/2ML IJ SOLN
INTRAMUSCULAR | Status: AC
  Filled 2015-03-02: qty 2

## 2015-03-02 MED ORDER — FENTANYL CITRATE (PF) 100 MCG/2ML IJ SOLN
INTRAMUSCULAR | Status: DC | PRN
  Administered 2015-03-02 (×3): 50 ug via INTRAVENOUS

## 2015-03-02 MED ORDER — IOHEXOL 300 MG/ML  SOLN
INTRAMUSCULAR | Status: DC | PRN
  Administered 2015-03-02: 60 mL via INTRA_ARTERIAL
  Administered 2015-03-02: 30 mL via INTRA_ARTERIAL

## 2015-03-02 MED ORDER — ALPRAZOLAM 0.25 MG PO TABS: 0.2500 mg | ORAL_TABLET | Freq: Three times a day (TID) | ORAL | Status: DC | PRN

## 2015-03-02 MED ORDER — ONDANSETRON HCL 4 MG/2ML IJ SOLN: 4.0000 mg | Freq: Four times a day (QID) | INTRAMUSCULAR | Status: DC | PRN

## 2015-03-02 SURGICAL SUPPLY — 9 items
CATH INFINITI 5FR ANG PIGTAIL (CATHETERS) ×3 IMPLANT
CATH INFINITI 5FR JL4 (CATHETERS) ×3 IMPLANT
CATH INFINITI JR4 5F (CATHETERS) ×3 IMPLANT
DEVICE CLOSURE MYNXGRIP 5F (Vascular Products) ×3 IMPLANT
KIT MANI 3VAL PERCEP (MISCELLANEOUS) ×3 IMPLANT
NEEDLE PERC 18GX7CM (NEEDLE) ×3 IMPLANT
PACK CARDIAC CATH (CUSTOM PROCEDURE TRAY) ×3 IMPLANT
SHEATH AVANTI 5FR X 11CM (SHEATH) ×3 IMPLANT
WIRE EMERALD 3MM-J .035X150CM (WIRE) ×3 IMPLANT

## 2015-03-02 NOTE — OR Nursing (Signed)
Pre cath teaching done . Pt concern about procedure . Discus in details about the procedure. Comfortable at present.

## 2015-03-02 NOTE — Progress Notes (Signed)
Patient came back from cath around 1550 and was able to sit up at 1600. Patient is currently walking around the unit with no problems. Site is clear of a hematoma or bleeding. Patient does feel some "pressure" at the access site, but denies taking tylenol. Awaiting Dr. Caryl Comes to arrive to release the patient to go home. Will continue to monitor.

## 2015-03-02 NOTE — Progress Notes (Signed)
Pt alertx4. VSS. No complaints of pain. NS on tele. Pt HR did drop to 40s once but returned back to baseline quickly. Pt checked and was resting in bed. Wife at bedside throughout night. BIPAP worn. Independent in room. Prepped for cardiac cath. Pt resting in bed. Will monitor.

## 2015-03-02 NOTE — Discharge Instructions (Signed)

## 2015-03-02 NOTE — OR Nursing (Signed)
Pt refused 2rd IV ,stated that "2 attempts had been done and MD had been notified and will start under sedation in procedure room": Cath lab notified

## 2015-03-02 NOTE — Discharge Summary (Signed)
Physician Discharge Summary  Patient ID: Lance Decker MRN: [id number] DOB/AGE: 01/19/1966 49 y.o.  Admit date: 02/28/2015 Discharge date: 03/02/2015  Admission Diagnoses: Chest pain  Discharge Diagnoses:  Principal Problem:   Pain in the chest Active Problems:   OSA treated with BiPAP   Diabetes mellitus, type 2   Discharged Condition: stable  Hospital Course: Admitted with chest and arm discomfort, worrisome for unstable angina.  Cardiac enzymes negative.  Cardiology evaluated patient and decision made to proceed with catheterization.  Cardiac cath 03/02/15 showed no significant CAD.  Patient with no further symptoms, ambulating well, and feels ready to go home.  Advised to slowly increase activity; out of work until Wednesday.    Discussed importance of diet, exercise, weight control.  Pt to f/u in next 2 weeks to further address. Check and record glucose daily.    Consults: cardiology  Significant Diagnostic Studies: cardiac catheterization negative for significant CAD   Discharge Exam: Blood pressure 119/71, pulse 70, temperature 97.7 F (36.5 C), temperature source Oral, resp. rate 20, height 6' (1.829 m), weight 160.2 kg (353 lb 2.8 oz), SpO2 98 %.    Discharge Instructions    Diet - low sodium heart healthy    Complete by:  As directed      Diet Carb Modified    Complete by:  As directed      Increase activity slowly    Complete by:  As directed             Medication List    TAKE these medications        aspirin 81 MG tablet  Take 81 mg by mouth daily.     metFORMIN 500 MG tablet  Commonly known as:  GLUCOPHAGE  Take 500 mg by mouth 2 (two) times daily with a meal.     multivitamin tablet  Take 1 tablet by mouth daily.     omeprazole 20 MG capsule  Commonly known as:  PRILOSEC  Take 1 capsule (20 mg total) by mouth 2 (two) times daily before a meal. X 7 days           Follow-up Information    Follow up with Izzac Rockett Briscoe Burns III, MD In 2 weeks.    Specialty:  Internal Medicine   Contact information:   [place] [address] [phone]       Signed: [place] Decker 03/02/2015, 5:57 PM

## 2015-03-02 NOTE — Progress Notes (Signed)
Order was changed to [facility] and medication did not show up on the [facility] to give at 0800. Per Dr. Caryl Comes, okay not to give insulin with blood sugar being 135 and patient refused.

## 2015-03-02 NOTE — Progress Notes (Signed)
Discharge instructions with after visit summary given. Omeprazole education and prescription given. Patient instructed about chest pain with handout and vascular access site instructions given. IV and tele discontinued. Patient has no further questions. Will follow up with Dr. Caryl Comes in 2 weeks.

## 2015-03-02 NOTE — Progress Notes (Signed)
SUBJECTIVE:  Admitted with [place]; no further chest/shoulder pain, though did have some diaphoresis.  Wife at bedside, wonders if anxiety may play a role as well.  No new c/o  ______________________________________________________________________  ROS: Please see HPI; remainder of complete 10 point ROS is negative   Past Medical History  Diagnosis Date  . MI (myocardial infarction)   . Diabetes mellitus without complication   . Sleep apnea   . H/O alcohol abuse     none since 1992    Past Surgical History  Procedure Laterality Date  . Pilonidal cyst / sinus excision    . Appendectomy       Current facility-administered medications:  .  0.9 %  sodium chloride infusion, 250 mL, Intravenous, PRN, Dwayne D Callwood, MD .  0.9% sodium chloride infusion, 1 mL/kg/hr, Intravenous, Continuous, Dwayne D Callwood, MD, Last Rate: 162.4 mL/hr at 03/01/15 2031, 1 mL/kg/hr at 03/01/15 2031 .  aspirin EC tablet 81 mg, 81 mg, Oral, Daily, Lance Coon, MD, 81 mg at 03/01/15 1004 .  insulin aspart (novoLOG) injection 0-9 Units, 0-9 Units, Subcutaneous, TID PC & HS, Vishwanath Hande, MD, 0 Units at 03/01/15 1230 .  nitroGLYCERIN (NITROSTAT) SL tablet 0.4 mg, 0.4 mg, Sublingual, Q5 min PRN, Lance Coon, MD .  sodium chloride 0.9 % injection 3 mL, 3 mL, Intravenous, Q12H, Lance Coon, MD, 3 mL at 03/01/15 2200 .  sodium chloride 0.9 % injection 3 mL, 3 mL, Intravenous, Q12H, Dwayne D Callwood, MD, 3 mL at 03/01/15 2200 .  sodium chloride 0.9 % injection 3 mL, 3 mL, Intravenous, PRN, Dwayne D Callwood, MD  PHYSICAL EXAM:  BP 119/83 mmHg  Pulse 61  Temp(Src) 97.3 F (36.3 C) (Axillary)  Resp 18  Ht 6' (1.829 m)  Wt 160.2 kg (353 lb 2.8 oz)  BMI 47.89 kg/m2  SpO2 99%  General: obese male, in NAD HEENT: PERRL; OP moist without lesions. Neck: supple, trachea midline, no thyromegaly Chest: normal to palpation Lungs: clear bilaterally without retractions or wheezes Cardiovascular: RRR, no  murmur, no gallop; distal pulses 2+ Abdomen: soft, nontender, nondistended, positive bowel sounds Extremities: no clubbing, cyanosis, edema Neuro: alert and oriented, moves all extremities Derm: no significant rashes or nodules; good skin turgor Lymph: no cervical or supraclavicular lymphadenopathy  Labs and imaging studies were reviewed  ASSESSMENT/PLAN:   1. [place]- to cath today for further diagnosis and possible treatment 2. AODM- controlled; holding metformin for cath.  SSI as needed 3. OSA- on home CPAP whenever sleeping.

## 2015-03-03 ENCOUNTER — Encounter: Payer: Self-pay | Admitting: Internal Medicine

## 2015-05-06 ENCOUNTER — Encounter: Payer: Self-pay | Admitting: *Deleted

## 2015-05-06 ENCOUNTER — Inpatient Hospital Stay: Admission: RE | Admit: 2015-05-06 | Payer: Managed Care, Other (non HMO) | Source: Ambulatory Visit

## 2015-05-06 NOTE — Patient Instructions (Signed)
  Your procedure is scheduled on: 05-12-15 Report to [facility]  To find out your arrival time please call [phone] between 1PM - 3PM on 05-11-15 [facility])  Remember: Instructions that are not followed completely may result in serious medical risk, up to and including death, or upon the discretion of your surgeon and anesthesiologist your surgery may need to be rescheduled.    _X___ 1. Do not eat food or drink liquids after midnight. No gum chewing or hard candies.     _X___ 2. No Alcohol for 24 hours before or after surgery.   ____ 3. Bring all medications with you on the day of surgery if instructed.    _X___ 4. Notify your doctor if there is any change in your medical condition     (cold, fever, infections).     Do not wear jewelry, make-up, hairpins, clips or nail polish.  Do not wear lotions, powders, or perfumes. You may wear deodorant.  Do not shave 48 hours prior to surgery. Men may shave face and neck.  Do not bring valuables to the hospital.    [facility] is not responsible for any belongings or valuables.               Contacts, dentures or bridgework may not be worn into surgery.  Leave your suitcase in the car. After surgery it may be brought to your room.  For patients admitted to the hospital, discharge time is determined by your treatment team.   Patients discharged the day of surgery will not be allowed to drive home.   Please read over the following fact sheets that you were given:      _X___ Take these medicines the morning of surgery with A SIP OF WATER:    1. PRILOSEC  2. TAKE A PRILOSEC Monday NIGHT BEFORE BED  3.   4.  5.  6.  ____ Fleet Enema (as directed)   _X___ Use CHG Soap as directed  ____ Use inhalers on the day of surgery  _X___ Stop metformin 2 days prior to surgery-LAST DOSE WILL BE ON 05-09-15 (SATURDAY)    ____ Take 1/2 of usual insulin dose the night before surgery and none on the morning of surgery.    ____ Stop Coumadin/Plavix/aspirin-PT STOPPED ASPIRIN ON 05-05-15  ____ Stop Anti-inflammatories-NO NSAIDS OR ASPIRIN PRODUCTS-TYLENOL OK   ____ Stop supplements until after surgery.    _X___ Bring C-Pap to the hospital.

## 2015-05-07 ENCOUNTER — Encounter
Admission: RE | Admit: 2015-05-07 | Discharge: 2015-05-07 | Disposition: A | Discharge status: Home / Self Care | Payer: Managed Care, Other (non HMO) | Source: Ambulatory Visit | Attending: Surgery | Admitting: Surgery

## 2015-05-07 DIAGNOSIS — E669 Obesity, unspecified: Secondary | ICD-10-CM | POA: Diagnosis not present

## 2015-05-07 DIAGNOSIS — G4730 Sleep apnea, unspecified: Secondary | ICD-10-CM | POA: Diagnosis not present

## 2015-05-07 DIAGNOSIS — Z7982 Long term (current) use of aspirin: Secondary | ICD-10-CM | POA: Diagnosis not present

## 2015-05-07 DIAGNOSIS — Z79899 Other long term (current) drug therapy: Secondary | ICD-10-CM | POA: Diagnosis not present

## 2015-05-07 DIAGNOSIS — L0591 Pilonidal cyst without abscess: Secondary | ICD-10-CM | POA: Diagnosis not present

## 2015-05-07 DIAGNOSIS — Z01812 Encounter for preprocedural laboratory examination: Secondary | ICD-10-CM | POA: Diagnosis not present

## 2015-05-07 DIAGNOSIS — K219 Gastro-esophageal reflux disease without esophagitis: Secondary | ICD-10-CM | POA: Diagnosis not present

## 2015-05-07 DIAGNOSIS — Z6841 Body Mass Index (BMI) 40.0 and over, adult: Secondary | ICD-10-CM | POA: Diagnosis not present

## 2015-05-07 DIAGNOSIS — E119 Type 2 diabetes mellitus without complications: Secondary | ICD-10-CM | POA: Diagnosis not present

## 2015-05-07 LAB — BASIC METABOLIC PANEL
Anion gap: 10 (ref 5–15)
BUN: 18 mg/dL (ref 6–20)
CHLORIDE: 105 mmol/L (ref 101–111)
CO2: 25 mmol/L (ref 22–32)
Calcium: 9.1 mg/dL (ref 8.9–10.3)
Creatinine, Ser: 0.7 mg/dL (ref 0.61–1.24)
GFR calc Af Amer: >60 mL/min (ref >60)
Glucose, Bld: 136 mg/dL — ABNORMAL HIGH (ref 65–99)
POTASSIUM: 4 mmol/L (ref 3.5–5.1)
Sodium: 140 mmol/L (ref 135–145)

## 2015-05-12 ENCOUNTER — Ambulatory Visit: Payer: Managed Care, Other (non HMO) | Admitting: Anesthesiology

## 2015-05-12 ENCOUNTER — Ambulatory Visit
Admission: RE | Admit: 2015-05-12 | Discharge: 2015-05-12 | Disposition: A | Discharge status: Home / Self Care | Payer: Managed Care, Other (non HMO) | Source: Ambulatory Visit | Attending: Surgery | Admitting: Surgery

## 2015-05-12 ENCOUNTER — Encounter: Payer: Self-pay | Admitting: *Deleted

## 2015-05-12 ENCOUNTER — Encounter
Admission: RE | Disposition: A | Discharge status: Home / Self Care | Payer: Self-pay | Source: Ambulatory Visit | Attending: Surgery

## 2015-05-12 DIAGNOSIS — Z01812 Encounter for preprocedural laboratory examination: Secondary | ICD-10-CM | POA: Insufficient documentation

## 2015-05-12 DIAGNOSIS — G4730 Sleep apnea, unspecified: Secondary | ICD-10-CM | POA: Insufficient documentation

## 2015-05-12 DIAGNOSIS — L0591 Pilonidal cyst without abscess: Secondary | ICD-10-CM | POA: Diagnosis not present

## 2015-05-12 DIAGNOSIS — E119 Type 2 diabetes mellitus without complications: Secondary | ICD-10-CM | POA: Insufficient documentation

## 2015-05-12 DIAGNOSIS — Z7982 Long term (current) use of aspirin: Secondary | ICD-10-CM | POA: Insufficient documentation

## 2015-05-12 DIAGNOSIS — Z6841 Body Mass Index (BMI) 40.0 and over, adult: Secondary | ICD-10-CM | POA: Insufficient documentation

## 2015-05-12 DIAGNOSIS — K219 Gastro-esophageal reflux disease without esophagitis: Secondary | ICD-10-CM | POA: Insufficient documentation

## 2015-05-12 DIAGNOSIS — Z79899 Other long term (current) drug therapy: Secondary | ICD-10-CM | POA: Insufficient documentation

## 2015-05-12 DIAGNOSIS — E669 Obesity, unspecified: Secondary | ICD-10-CM | POA: Insufficient documentation

## 2015-05-12 HISTORY — DX: Gastro-esophageal reflux disease without esophagitis: K21.9

## 2015-05-12 HISTORY — PX: PILONIDAL CYST EXCISION: SHX744

## 2015-05-12 LAB — GLUCOSE, CAPILLARY
Glucose-Capillary: 148 mg/dL — ABNORMAL HIGH (ref 65–99)
Glucose-Capillary: 175 mg/dL — ABNORMAL HIGH (ref 65–99)

## 2015-05-12 SURGERY — EXCISION, PILONIDAL CYST, EXTENSIVE
Anesthesia: Spinal | Wound class: Dirty or Infected

## 2015-05-12 MED ORDER — FENTANYL CITRATE (PF) 100 MCG/2ML IJ SOLN
INTRAMUSCULAR | Status: DC | PRN
  Administered 2015-05-12 (×2): 50 ug via INTRAVENOUS

## 2015-05-12 MED ORDER — METHYLENE BLUE 1 % INJ SOLN
INTRAMUSCULAR | Status: AC
  Filled 2015-05-12: qty 10

## 2015-05-12 MED ORDER — ONDANSETRON HCL 4 MG/2ML IJ SOLN: 4.0000 mg | Freq: Once | INTRAMUSCULAR | Status: DC | PRN

## 2015-05-12 MED ORDER — DEXMEDETOMIDINE HCL 200 MCG/2ML IV SOLN
INTRAVENOUS | Status: DC | PRN
  Administered 2015-05-12: 8 ug via INTRAVENOUS
  Administered 2015-05-12: 12 ug via INTRAVENOUS

## 2015-05-12 MED ORDER — LACTATED RINGERS IV SOLN
INTRAVENOUS | Status: DC | PRN
  Administered 2015-05-12: 09:00:00 via INTRAVENOUS

## 2015-05-12 MED ORDER — BUPIVACAINE IN DEXTROSE 0.75-8.25 % IT SOLN
INTRATHECAL | Status: DC | PRN
  Administered 2015-05-12: 1.6 mL via INTRATHECAL

## 2015-05-12 MED ORDER — PHENYLEPHRINE HCL 10 MG/ML IJ SOLN
100.0000 ug | Freq: Once | INTRAMUSCULAR | Status: AC
  Administered 2015-05-12: 100 ug via INTRAVENOUS

## 2015-05-12 MED ORDER — SODIUM CHLORIDE 0.9 % IV BOLUS (SEPSIS)
1000.0000 mL | Freq: Once | INTRAVENOUS | Status: AC
  Administered 2015-05-12: 1000 mL via INTRAVENOUS

## 2015-05-12 MED ORDER — HYDROCODONE-ACETAMINOPHEN 5-325 MG PO TABS: 1.0000 | ORAL_TABLET | ORAL | Status: DC | PRN

## 2015-05-12 MED ORDER — METRONIDAZOLE IN NACL 5-0.79 MG/ML-% IV SOLN
500.0000 mg | Freq: Once | INTRAVENOUS | Status: AC
  Administered 2015-05-12: 500 mg via INTRAVENOUS
  Filled 2015-05-12: qty 100

## 2015-05-12 MED ORDER — BUPIVACAINE-EPINEPHRINE (PF) 0.5% -1:200000 IJ SOLN
INTRAMUSCULAR | Status: AC
  Filled 2015-05-12: qty 30

## 2015-05-12 MED ORDER — VANCOMYCIN HCL IN DEXTROSE 1-5 GM/200ML-% IV SOLN
INTRAVENOUS | Status: AC
  Administered 2015-05-12: 1000 mg via INTRAVENOUS
  Filled 2015-05-12: qty 200

## 2015-05-12 MED ORDER — MIDAZOLAM HCL 2 MG/2ML IJ SOLN
INTRAMUSCULAR | Status: AC
  Administered 2015-05-12: 1 mg
  Filled 2015-05-12: qty 2

## 2015-05-12 MED ORDER — BUPIVACAINE-EPINEPHRINE (PF) 0.5% -1:200000 IJ SOLN
INTRAMUSCULAR | Status: DC | PRN
  Administered 2015-05-12: 12 mL

## 2015-05-12 MED ORDER — FENTANYL CITRATE (PF) 100 MCG/2ML IJ SOLN: 25.0000 ug | INTRAMUSCULAR | Status: DC | PRN

## 2015-05-12 MED ORDER — VANCOMYCIN HCL IN DEXTROSE 1-5 GM/200ML-% IV SOLN
1000.0000 mg | Freq: Once | INTRAVENOUS | Status: AC
  Administered 2015-05-12: 1000 mg via INTRAVENOUS

## 2015-05-12 MED ORDER — PHENYLEPHRINE HCL 10 MG/ML IJ SOLN
INTRAMUSCULAR | Status: DC | PRN
  Administered 2015-05-12 (×2): 200 ug via INTRAVENOUS

## 2015-05-12 MED ORDER — ONDANSETRON HCL 4 MG/2ML IJ SOLN
INTRAMUSCULAR | Status: DC | PRN
  Administered 2015-05-12: 4 mg via INTRAVENOUS

## 2015-05-12 MED ORDER — MIDAZOLAM HCL 5 MG/ML IJ SOLN: 1.0000 mg | Freq: Once | INTRAMUSCULAR | Status: DC

## 2015-05-12 SURGICAL SUPPLY — 20 items
BLADE CLIPPER SURG (BLADE) ×3 IMPLANT
CANISTER SUCT 1200ML W/VALVE (MISCELLANEOUS) ×3 IMPLANT
CHLORAPREP W/TINT 26ML (MISCELLANEOUS) ×3 IMPLANT
DRAPE LAPAROTOMY 100X77 ABD (DRAPES) ×3 IMPLANT
GAUZE SPONGE 4X4 12PLY STRL (GAUZE/BANDAGES/DRESSINGS) ×3 IMPLANT
GLOVE BIO SURGEON STRL SZ7.5 (GLOVE) ×15 IMPLANT
GOWN STRL REUS W/ TWL LRG LVL3 (GOWN DISPOSABLE) ×2 IMPLANT
GOWN STRL REUS W/TWL LRG LVL3 (GOWN DISPOSABLE) ×4
LABEL OR SOLS (LABEL) ×3 IMPLANT
NDL SAFETY 25GX1.5 (NEEDLE) ×3 IMPLANT
NS IRRIG 500ML POUR BTL (IV SOLUTION) ×3 IMPLANT
PACK BASIN MINOR ARMC (MISCELLANEOUS) ×3 IMPLANT
PAD GROUND ADULT SPLIT (MISCELLANEOUS) ×3 IMPLANT
STRAP SAFETY BODY (MISCELLANEOUS) ×3 IMPLANT
SUT CHROMIC 2 0 CT 1 (SUTURE) IMPLANT
SUT CHROMIC 3 0 SH 27 (SUTURE) IMPLANT
SUT ETHILON 3-0 FS-10 30 BLK (SUTURE) ×3
SUTURE EHLN 3-0 FS-10 30 BLK (SUTURE) ×1 IMPLANT
SWABSTK COMLB BENZOIN TINCTURE (MISCELLANEOUS) ×3 IMPLANT
SYRINGE 10CC LL (SYRINGE) ×3 IMPLANT

## 2015-05-12 NOTE — Anesthesia Procedure Notes (Signed)
Spinal Patient location during procedure: OR Start time: 05/12/2015 9:29 AM End time: 05/12/2015 9:34 AM Staffing Anesthesiologist: Marline Backbone F Performed by: anesthesiologist  Preanesthetic Checklist Completed: patient identified, site marked, surgical consent, pre-op evaluation, timeout performed, IV checked, risks and benefits discussed and monitors and equipment checked Spinal Block Patient position: sitting Prep: Betadine Patient monitoring: heart rate and continuous pulse ox Approach: right paramedian Location: L3-4 Injection technique: single-shot Needle Needle type: Tuohy  Needle gauge: 22 G Needle length: 12.7 cm Needle insertion depth: 7 cm Assessment Sensory level: T8

## 2015-05-12 NOTE — Anesthesia Postprocedure Evaluation (Signed)
  Anesthesia Post-op Note  Patient: Lance Decker  Procedure(s) Performed: Procedure(s): CYST EXCISION PILONIDAL EXTENSIVE (N/A)  Anesthesia type:General  Patient location: PACU  Post pain: Pain level controlled  Post assessment: Post-op Vital signs reviewed, Patient's Cardiovascular Status Stable, Respiratory Function Stable, Patent Airway and No signs of Nausea or vomiting  Post vital signs: Reviewed and stable  Last Vitals:  Filed Vitals:   05/12/15 0806  BP: 105/54  Pulse: 78  Temp: 36.7 C  Resp: 16    Level of consciousness: awake, alert  and patient cooperative  Complications: No apparent anesthesia complications

## 2015-05-12 NOTE — Discharge Instructions (Addendum)
Take Tylenol or Norco if needed for pain. May resume aspirin 1 week after surgery. Lay on side or stomach when in bed. Limit walking and sitting as discussed. Remove dressing on Thursday. May then shower Tuck gauze or pad in underwear as needed for drainage.AMBULATORY SURGERY  DISCHARGE INSTRUCTIONS   1) The drugs that you were given will stay in your system until tomorrow so for the next 24 hours you should not:  A) Drive an automobile B) Make any legal decisions C) Drink any alcoholic beverage   2) You may resume regular meals tomorrow.  Today it is better to start with liquids and gradually work up to solid foods.  You may eat anything you prefer, but it is better to start with liquids, then soup and crackers, and gradually work up to solid foods.   3) Please notify your doctor immediately if you have any unusual bleeding, trouble breathing, redness and pain at the surgery site, drainage, fever, or pain not relieved by medication.    4) Additional Instructions:        Please contact your physician with any problems or Same Day Surgery at [phone], Monday through Friday 6 am to 4 pm, or [place] at [facility] number at [phone].

## 2015-05-12 NOTE — Op Note (Signed)
OPERATIVE REPORT  PREOPERATIVE  DIAGNOSIS: . Pilonidal cyst  POSTOPERATIVE DIAGNOSIS: . Pilonidal cyst  PROCEDURE: . Excision pilonidal cyst  ANESTHESIA:  General  SURGEON: Rochel Brome  MD   INDICATIONS: . He has had 3 episodes of infection of pilonidal cyst. Surgery was recommended for definitive treatment at a time when the infection has resolved.  With the patient on the operating table in the prone position under spinal anesthesia the intergluteal cleft was clipped and prepared with Betadine solution and draped in a sterile manner. There was a visible pore and the skin. A probe was inserted and went in approximately 1 cm. The probe was removed. A longitudinally oriented incision was made to remove an ellipse of skin proximally 4 cm in length and 1 cm wide. The dissection was carried down through subcutaneous tissues. Electrocautery was used for hemostasis. The cyst was excised and the specimen was submitted in formalin for routine pathology. The wound was inspected and numerous small bleeding points were cauterized. The subcutaneous tissues were infiltrated with half percent Sensorcaine with epinephrine. The wound was closed with interrupted 3-0 nylon vertical mattress sutures. The wound was painted with Betadine. 4 x 4 cotton gauze was applied with benzoin and 3 inch silk tape.  The patient tolerated the procedure satisfactorily and was then carried to the recovery room for postoperative care   [facility] M.D.

## 2015-05-12 NOTE — H&P (Signed)
  He reports no change in overall condition since the day of the office visit. He has had no additional swelling or drainage.  He is been off his aspirin for 1 week.  Discussed the plan for excision of pilonidal cyst

## 2015-05-12 NOTE — Transfer of Care (Signed)
Immediate Anesthesia Transfer of Care Note  Patient: Lance Decker  Procedure(s) Performed: Procedure(s): CYST EXCISION PILONIDAL EXTENSIVE (N/A)  Patient Location: PACU  Anesthesia Type:Spinal  Level of Consciousness: awake, alert  and oriented  Airway & Oxygen Therapy: Patient Spontanous Breathing  Post-op Assessment: Report given to RN and Post -op Vital signs reviewed and stable  Post vital signs: Reviewed and stable  Last Vitals:  Filed Vitals:   05/12/15 0806  BP: 105/54  Pulse: 78  Temp: 36.7 C  Resp: 16    Complications: No apparent anesthesia complications

## 2015-05-12 NOTE — Anesthesia Postprocedure Evaluation (Signed)
  Anesthesia Post-op Note  Patient: Lance Decker  Procedure(s) Performed: Procedure(s): CYST EXCISION PILONIDAL EXTENSIVE (N/A)  Anesthesia type:Spinal  Patient location: PACU  Post pain: Pain level controlled  Post assessment: Post-op Vital signs reviewed, Patient's Cardiovascular Status Stable, Respiratory Function Stable, Patent Airway and No signs of Nausea or vomiting  Post vital signs: Reviewed and stable  Last Vitals:  Filed Vitals:   05/12/15 1115  BP: 95/43  Pulse: 61  Temp:   Resp: 23    Level of consciousness: awake, alert  and patient cooperative  Complications: No apparent anesthesia complications

## 2015-05-12 NOTE — Anesthesia Preprocedure Evaluation (Addendum)
Anesthesia Evaluation  Patient identified by MRN, date of birth, ID band Patient awake    Reviewed: Allergy & Precautions, NPO status , Patient's Chart, lab work & pertinent test results  History of Anesthesia Complications Negative for: history of anesthetic complications  Airway Mallampati: III       Dental  (+) Teeth Intact   Pulmonary sleep apnea ,    + decreased breath sounds      Cardiovascular + Past MI Normal cardiovascular exam    Neuro/Psych    GI/Hepatic Neg liver ROS, GERD-  ,  Endo/Other  diabetes, Type 2, Oral Hypoglycemic Agents  Renal/GU      Musculoskeletal   Abdominal (+) + obese,   Peds  Hematology   Anesthesia Other Findings   Reproductive/Obstetrics                            Anesthesia Physical Anesthesia Plan  ASA: IV  Anesthesia Plan: Spinal   Post-op Pain Management:    Induction: Intravenous  Airway Management Planned: Natural Airway  Additional Equipment:   Intra-op Plan:   Post-operative Plan: Extubation in OR  Informed Consent: I have reviewed the patients History and Physical, chart, labs and discussed the procedure including the risks, benefits and alternatives for the proposed anesthesia with the patient or authorized representative who has indicated his/her understanding and acceptance.     Plan Discussed with: CRNA  Anesthesia Plan Comments:        Anesthesia Quick Evaluation

## 2015-05-12 NOTE — Progress Notes (Signed)
Dr Izetta Dakin in to see pt   Blood pressure 107/76 on back  Blood pressure cuff right arm

## 2015-05-13 LAB — SURGICAL PATHOLOGY

## 2015-12-16 ENCOUNTER — Emergency Department: Payer: Managed Care, Other (non HMO)

## 2015-12-16 DIAGNOSIS — R0789 Other chest pain: Secondary | ICD-10-CM | POA: Diagnosis not present

## 2015-12-16 DIAGNOSIS — Z7984 Long term (current) use of oral hypoglycemic drugs: Secondary | ICD-10-CM | POA: Insufficient documentation

## 2015-12-16 DIAGNOSIS — Z880 Allergy status to penicillin: Secondary | ICD-10-CM | POA: Diagnosis not present

## 2015-12-16 DIAGNOSIS — Z79899 Other long term (current) drug therapy: Secondary | ICD-10-CM | POA: Diagnosis not present

## 2015-12-16 DIAGNOSIS — Z7982 Long term (current) use of aspirin: Secondary | ICD-10-CM | POA: Diagnosis not present

## 2015-12-16 DIAGNOSIS — E119 Type 2 diabetes mellitus without complications: Secondary | ICD-10-CM | POA: Diagnosis not present

## 2015-12-16 DIAGNOSIS — R0600 Dyspnea, unspecified: Secondary | ICD-10-CM | POA: Diagnosis not present

## 2015-12-16 LAB — COMPREHENSIVE METABOLIC PANEL
ALT: 79 U/L — ABNORMAL HIGH (ref 17–63)
AST: 50 U/L — AB (ref 15–41)
Albumin: 4.1 g/dL (ref 3.5–5.0)
Alkaline Phosphatase: 68 U/L (ref 38–126)
Anion gap: 7 (ref 5–15)
BUN: 16 mg/dL (ref 6–20)
CHLORIDE: 104 mmol/L (ref 101–111)
CO2: 26 mmol/L (ref 22–32)
Calcium: 9.1 mg/dL (ref 8.9–10.3)
Creatinine, Ser: 0.8 mg/dL (ref 0.61–1.24)
GFR calc Af Amer: >60 mL/min (ref >60)
GFR calc non Af Amer: >60 mL/min (ref >60)
GLUCOSE: 191 mg/dL — AB (ref 65–99)
POTASSIUM: 3.8 mmol/L (ref 3.5–5.1)
Sodium: 137 mmol/L (ref 135–145)
Total Bilirubin: 1.1 mg/dL (ref 0.3–1.2)
Total Protein: 7.2 g/dL (ref 6.5–8.1)

## 2015-12-16 LAB — CBC
HCT: 42.6 % (ref 40.0–52.0)
Hemoglobin: 14.8 g/dL (ref 13.0–18.0)
MCH: 29 pg (ref 26.0–34.0)
MCHC: 34.6 g/dL (ref 32.0–36.0)
MCV: 83.8 fL (ref 80.0–100.0)
PLATELETS: 181 10*3/uL (ref 150–440)
RBC: 5.09 MIL/uL (ref 4.40–5.90)
RDW: 13.4 % (ref 11.5–14.5)
WBC: 9.1 10*3/uL (ref 3.8–10.6)

## 2015-12-16 LAB — TROPONIN I: Troponin I: <0.03 ng/mL (ref <0.031)

## 2015-12-16 NOTE — ED Notes (Signed)
Onset this morning of generalized body aches, chest discomfort, difficulty catching a full breath. Has many comorbities including heart history, diabetes, kidney function decreased. Patient states he has been clammy, hot then cold all day long. Checked his sugar at home and it was 170. Had his normal dose of metformin this morning.

## 2015-12-17 ENCOUNTER — Emergency Department
Admission: EM | Admit: 2015-12-17 | Discharge: 2015-12-17 | Disposition: A | Discharge status: Home / Self Care | Payer: Managed Care, Other (non HMO) | Attending: Emergency Medicine | Admitting: Emergency Medicine

## 2015-12-17 DIAGNOSIS — R0600 Dyspnea, unspecified: Secondary | ICD-10-CM

## 2015-12-17 LAB — FIBRIN DERIVATIVES D-DIMER (ARMC ONLY): Fibrin derivatives D-dimer (ARMC): 130 (ref 0–499)

## 2015-12-17 MED ORDER — IPRATROPIUM-ALBUTEROL 0.5-2.5 (3) MG/3ML IN SOLN
RESPIRATORY_TRACT | Status: AC
  Administered 2015-12-17: 3 mL via RESPIRATORY_TRACT
  Filled 2015-12-17: qty 3

## 2015-12-17 MED ORDER — FLUTICASONE-SALMETEROL 250-50 MCG/DOSE IN AEPB: 1.0000 | INHALATION_SPRAY | Freq: Two times a day (BID) | RESPIRATORY_TRACT | Status: DC

## 2015-12-17 MED ORDER — IPRATROPIUM-ALBUTEROL 0.5-2.5 (3) MG/3ML IN SOLN
3.0000 mL | Freq: Once | RESPIRATORY_TRACT | Status: AC
  Administered 2015-12-17: 3 mL via RESPIRATORY_TRACT

## 2015-12-17 NOTE — Discharge Instructions (Signed)
Shortness of Breath °Shortness of breath means you have trouble breathing. It could also mean that you have a medical problem. You should get immediate medical care for shortness of breath. °CAUSES  °· Not enough oxygen in the air such as with high altitudes or a smoke-filled room. °· Certain lung diseases, infections, or problems. °· Heart disease or conditions, such as angina or heart failure. °· Low red blood cells (anemia). °· Poor physical fitness, which can cause shortness of breath when you exercise. °· Chest or back injuries or stiffness. °· Being overweight. °· Smoking. °· Anxiety, which can make you feel like you are not getting enough air. °DIAGNOSIS  °Serious medical problems can often be found during your physical exam. Tests may also be done to determine why you are having shortness of breath. Tests may include: °· Chest X-rays. °· Lung function tests. °· Blood tests. °· An electrocardiogram (ECG). °· An ambulatory electrocardiogram. An ambulatory ECG records your heartbeat patterns over a 24-hour period. °· Exercise testing. °· A transthoracic echocardiogram (TTE). During echocardiography, sound waves are used to evaluate how blood flows through your heart. °· A transesophageal echocardiogram (TEE). °· Imaging scans. °Your health care provider may not be able to find a cause for your shortness of breath after your exam. In this case, it is important to have a follow-up exam with your health care provider as directed.  °TREATMENT  °Treatment for shortness of breath depends on the cause of your symptoms and can vary greatly. °HOME CARE INSTRUCTIONS  °· Do not smoke. Smoking is a common cause of shortness of breath. If you smoke, ask for help to quit. °· Avoid being around chemicals or things that may bother your breathing, such as paint fumes and dust. °· Rest as needed. Slowly resume your usual activities. °· If medicines were prescribed, take them as directed for the full length of time directed. This  includes oxygen and any inhaled medicines. °· Keep all follow-up appointments as directed by your health care provider. °SEEK MEDICAL CARE IF:  °· Your condition does not improve in the time expected. °· You have a hard time doing your normal activities even with rest. °· You have any new symptoms. °SEEK IMMEDIATE MEDICAL CARE IF:  °· Your shortness of breath gets worse. °· You feel light-headed, faint, or develop a cough not controlled with medicines. °· You start coughing up blood. °· You have pain with breathing. °· You have chest pain or pain in your arms, shoulders, or abdomen. °· You have a fever. °· You are unable to walk up stairs or exercise the way you normally do. °MAKE SURE YOU: °· Understand these instructions. °· Will watch your condition. °· Will get help right away if you are not doing well or get worse. °  °This information is not intended to replace advice given to you by your health care provider. Make sure you discuss any questions you have with your health care provider. °  °Document Released: 06/28/2001 Document Revised: 10/08/2013 Document Reviewed: 12/19/2011 °Elsevier Interactive Patient Education ©2016 [employer] ° °Please return immediately if condition worsens. Please contact her primary physician or the physician you were given for referral. If you have any specialist physicians involved in her treatment and plan please also contact them. Thank you for using [facility]. ° °

## 2015-12-17 NOTE — ED Notes (Signed)
MD at bedside. 

## 2015-12-17 NOTE — ED Provider Notes (Signed)
Time Seen: Approximately 0105  I have reviewed the triage notes  Chief Complaint: Generalized Body Aches and Shortness of Breath   History of Present Illness: Lance Decker is a 50 y.o. male who states he's had some chest tightness and feels like he can't quite catch his breath which is been occurring most of the day. Patient has a previous history of a heart catheterization last May which did not show any cardiovascular disease. He states he has the same type similar symptoms as he had at that time. His wife states that they felt it most likely was anxiety and he also has history of sleep apnea and has had difficulty maintaining his feeling of being short of breath even with his BiPAP mask at home and also used an albuterol inhaler without relief. He denies any chest pain at present he denies any fever, chills, productive cough. He denies any pulmonary emboli risk factors other than a sedentary lifestyle. He denies any calf tenderness or swelling. Denies any arm, jaw, neck pain. He states he's been cool and clammy throughout most of the day. Past Medical History  Diagnosis Date  . Diabetes mellitus without complication ([place])   . Sleep apnea   . H/O alcohol abuse     none since 1992  . MI (myocardial infarction) ([place]) 2002  . GERD (gastroesophageal reflux disease)     Patient Active Problem List   Diagnosis Date Noted  . Pain in the chest 03/02/2015  . OSA treated with BiPAP 03/01/2015  . Diabetes mellitus, type 2 ([place]) 03/01/2015    Past Surgical History  Procedure Laterality Date  . Pilonidal cyst / sinus excision    . Appendectomy    . Cardiac catheterization Left 03/02/2015    Procedure: Left Heart Cath and Coronary Angiography;  Surgeon: Yolonda Kida, MD;  Location: [facility];  Service: Cardiovascular;  Laterality: Left;  . Anal fissurectomy    . Pilonidal cyst excision N/A 05/12/2015    Procedure: CYST EXCISION PILONIDAL EXTENSIVE;  Surgeon: Leonie Green, MD;  Location: [facility];  Service: General;  Laterality: N/A;    Past Surgical History  Procedure Laterality Date  . Pilonidal cyst / sinus excision    . Appendectomy    . Cardiac catheterization Left 03/02/2015    Procedure: Left Heart Cath and Coronary Angiography;  Surgeon: Yolonda Kida, MD;  Location: [facility];  Service: Cardiovascular;  Laterality: Left;  . Anal fissurectomy    . Pilonidal cyst excision N/A 05/12/2015    Procedure: CYST EXCISION PILONIDAL EXTENSIVE;  Surgeon: Leonie Green, MD;  Location: [facility];  Service: General;  Laterality: N/A;    Current Outpatient Rx  Name  Route  Sig  Dispense  Refill  . aspirin 81 MG tablet   Oral   Take 81 mg by mouth daily.         Marland Kitchen HYDROcodone-acetaminophen (NORCO) 5-325 MG per tablet   Oral   Take 1-2 tablets by mouth every 4 (four) hours as needed for moderate pain.   12 tablet   0   . metFORMIN (GLUCOPHAGE) 500 MG tablet   Oral   Take 500 mg by mouth 2 (two) times daily with a meal.         . Multiple Vitamin (MULTIVITAMIN) tablet   Oral   Take 1 tablet by mouth daily.         Marland Kitchen omeprazole (PRILOSEC) 20 MG capsule   Oral  Take 1 capsule (20 mg total) by mouth 2 (two) times daily before a meal. X 7 days Patient taking differently: Take 20 mg by mouth as needed. X 7 days   14 capsule   0     Allergies:  Penicillins  Family History: Family History  Problem Relation Age of Onset  . Heart disease Father   . Diabetes Mother   . Diabetes Maternal Grandfather   . Diabetes Maternal Grandmother     Social History: Social History  Substance Use Topics  . Smoking status: Never Smoker   . Smokeless tobacco: Current User    Types: Snuff  . Alcohol Use: No     Review of Systems:   10 point review of systems was performed and was otherwise negative:  Constitutional: No fever Eyes: No visual disturbances ENT: No sore throat, ear pain Cardiac: No current chest  pain Respiratory: He denies any persistent shortness of breath he states it feels like he can't quite take a full breath. Abdomen: No abdominal pain, no vomiting, No diarrhea Endocrine: No weight loss, No night sweats Extremities: No peripheral edema, cyanosis Skin: No rashes, easy bruising Neurologic: No focal weakness, trouble with speech or swollowing Urologic: No dysuria, Hematuria, or urinary frequency He states his blood sugars have been running normal at home.  Physical Exam:  ED Triage Vitals  Enc Vitals Group     BP 12/16/15 2308 122/71 mmHg     Pulse Rate 12/16/15 2308 80     Resp 12/16/15 2308 20     Temp 12/16/15 2308 97.9 F (36.6 C)     Temp Source 12/16/15 2308 Oral     SpO2 12/16/15 2308 98 %     Weight 12/16/15 2303 356 lb (161.481 kg)     Height 12/16/15 2303 6\' 1"  (1.854 m)     Head Cir --      Peak Flow --      Pain Score 12/16/15 2303 6     Pain Loc --      Pain Edu? --      Excl. in [place]? --     General: Awake , Alert , and Oriented times 3; GCS 15 Head: Normal cephalic , atraumatic Eyes: Pupils equal , round, reactive to light Nose/Throat: No nasal drainage, patent upper airway without erythema or exudate.  Neck: Supple, Full range of motion, No anterior adenopathy or palpable thyroid masses Lungs: Clear to ascultation without wheezes , rhonchi, or rales Heart: Regular rate, regular rhythm without murmurs , gallops , or rubs Abdomen: Morbidly obese Soft, non tender without rebound, guarding , or rigidity; bowel sounds positive and symmetric in all 4 quadrants. No organomegaly .        Extremities: 2 plus symmetric pulses. No edema, clubbing or cyanosis Neurologic: normal ambulation, Motor symmetric without deficits, sensory intact Skin: warm, dry, no rashes   Labs:   All laboratory work was reviewed including any pertinent negatives or positives listed below:  Labs Reviewed  COMPREHENSIVE METABOLIC PANEL - Abnormal; Notable for the following:     Glucose, Bld 191 (*)    AST 50 (*)    ALT 79 (*)    All other components within normal limits  CBC  TROPONIN I  FIBRIN DERIVATIVES D-DIMER ([facility] ONLY)   reviewed the patient's laboratory work showed no significant findings  EKG: ED ECG REPORT I, Daymon Larsen, the attending physician, personally viewed and interpreted this ECG.  Date: 12-16-2015 EKG Time: 2304 Rate: *81  Rhythm: normal sinus rhythm QRS Axis: normal Intervals: normal ST/T Wave abnormalities: normal Conduction Disturbances: none Narrative Interpretation: unremarkable Normal EKG   Radiology: * Narrative:    CLINICAL DATA: Onset this morning of generalized body aches, chest discomfort, difficulty catching a full breath. Has many comorbities including MI, diabetes, kidney function decreased. Patient states he has been clammy, hot then cold all day long. Checked his sugar at home and it was 170. Had his normal dose of metformin this morning.  EXAM: CHEST 2 VIEW  COMPARISON: 02/28/2015.  FINDINGS: The heart size and mediastinal contours are within normal limits. Mild interstitial prominence. Lungs otherwise clear. No pleural effusion or pneumothorax. The visualized skeletal structures are unremarkable.  IMPRESSION: No active cardiopulmonary disease.   Electronically Signed By: Lajean Manes M.D.        I personally reviewed the radiologic studies     ED Course:  Patient's symptoms seemed to resolve after a nebulizer treatment here in emergency department. I felt this was unlikely to be a life-threatening cause for her dyspnea such as a pulmonary embolism given his clinical presentation and negative D-dimer test and felt we could stop the evaluation at this point. The patient's chest x-ray appears normal and cardio vascular studies were negative and the patient just recently had a negative heart catheterization last year. Patient states differential seemed and they'll around either anxiety,  esophageal reflux disease, or some reaction to plastic city uses as a hobby. He apparently makes fishing worse. His wife states he's been under a lot of stress and her seemed to be some anxiety component.    Assessment:  Acute unspecified dyspnea     Plan: * Outpatient management Patient was advised to return immediately if condition worsens. Patient was advised to follow up with their primary care physician or other specialized physicians involved in their outpatient care             Daymon Larsen, MD 12/17/15 [phone]

## 2016-04-05 DIAGNOSIS — R748 Abnormal levels of other serum enzymes: Secondary | ICD-10-CM | POA: Insufficient documentation

## 2016-04-05 HISTORY — DX: Abnormal levels of other serum enzymes: R74.8

## 2016-08-23 ENCOUNTER — Encounter: Payer: Self-pay | Admitting: Internal Medicine

## 2017-01-25 DIAGNOSIS — Z6841 Body Mass Index (BMI) 40.0 and over, adult: Secondary | ICD-10-CM | POA: Insufficient documentation

## 2017-04-24 ENCOUNTER — Emergency Department: Payer: Managed Care, Other (non HMO)

## 2017-04-24 ENCOUNTER — Emergency Department
Admission: EM | Admit: 2017-04-24 | Discharge: 2017-04-24 | Disposition: A | Discharge status: Home / Self Care | Payer: Managed Care, Other (non HMO) | Attending: Emergency Medicine | Admitting: Emergency Medicine

## 2017-04-24 ENCOUNTER — Encounter: Payer: Self-pay | Admitting: Emergency Medicine

## 2017-04-24 DIAGNOSIS — R109 Unspecified abdominal pain: Secondary | ICD-10-CM

## 2017-04-24 DIAGNOSIS — E119 Type 2 diabetes mellitus without complications: Secondary | ICD-10-CM | POA: Diagnosis not present

## 2017-04-24 DIAGNOSIS — Z7984 Long term (current) use of oral hypoglycemic drugs: Secondary | ICD-10-CM | POA: Insufficient documentation

## 2017-04-24 DIAGNOSIS — Z7982 Long term (current) use of aspirin: Secondary | ICD-10-CM | POA: Diagnosis not present

## 2017-04-24 DIAGNOSIS — Z79899 Other long term (current) drug therapy: Secondary | ICD-10-CM | POA: Diagnosis not present

## 2017-04-24 DIAGNOSIS — R319 Hematuria, unspecified: Secondary | ICD-10-CM | POA: Diagnosis not present

## 2017-04-24 DIAGNOSIS — F17220 Nicotine dependence, chewing tobacco, uncomplicated: Secondary | ICD-10-CM | POA: Diagnosis not present

## 2017-04-24 DIAGNOSIS — I252 Old myocardial infarction: Secondary | ICD-10-CM | POA: Insufficient documentation

## 2017-04-24 DIAGNOSIS — N201 Calculus of ureter: Secondary | ICD-10-CM | POA: Diagnosis not present

## 2017-04-24 DIAGNOSIS — N23 Unspecified renal colic: Secondary | ICD-10-CM | POA: Insufficient documentation

## 2017-04-24 LAB — BASIC METABOLIC PANEL
Anion gap: 9 (ref 5–15)
BUN: 16 mg/dL (ref 6–20)
CHLORIDE: 103 mmol/L (ref 101–111)
CO2: 26 mmol/L (ref 22–32)
Calcium: 9.7 mg/dL (ref 8.9–10.3)
Creatinine, Ser: 1.16 mg/dL (ref 0.61–1.24)
Glucose, Bld: 127 mg/dL — ABNORMAL HIGH (ref 65–99)
Potassium: 4.6 mmol/L (ref 3.5–5.1)
Sodium: 138 mmol/L (ref 135–145)

## 2017-04-24 LAB — URINALYSIS, COMPLETE (UACMP) WITH MICROSCOPIC
BACTERIA UA: NONE SEEN
BILIRUBIN URINE: NEGATIVE
Glucose, UA: NEGATIVE mg/dL
KETONES UR: NEGATIVE mg/dL
LEUKOCYTES UA: NEGATIVE
Nitrite: NEGATIVE
Protein, ur: NEGATIVE mg/dL
Specific Gravity, Urine: 1.025 (ref 1.005–1.030)
pH: 6 (ref 5.0–8.0)

## 2017-04-24 LAB — CBC
HCT: 45.6 % (ref 40.0–52.0)
HEMOGLOBIN: 15.7 g/dL (ref 13.0–18.0)
MCH: 28.7 pg (ref 26.0–34.0)
MCHC: 34.5 g/dL (ref 32.0–36.0)
MCV: 83.3 fL (ref 80.0–100.0)
Platelets: 223 10*3/uL (ref 150–440)
RBC: 5.47 MIL/uL (ref 4.40–5.90)
RDW: 13.7 % (ref 11.5–14.5)
WBC: 9.3 10*3/uL (ref 3.8–10.6)

## 2017-04-24 MED ORDER — OXYCODONE-ACETAMINOPHEN 5-325 MG PO TABS
1.0000 | ORAL_TABLET | Freq: Once | ORAL | Status: AC
  Administered 2017-04-24: 1 via ORAL

## 2017-04-24 MED ORDER — ONDANSETRON 4 MG PO TBDP: 4.0000 mg | ORAL_TABLET | Freq: Three times a day (TID) | ORAL | 0 refills | Status: DC | PRN

## 2017-04-24 MED ORDER — ONDANSETRON HCL 4 MG/2ML IJ SOLN
4.0000 mg | Freq: Once | INTRAMUSCULAR | Status: AC
Start: 2017-04-24 — End: 2017-04-24
  Administered 2017-04-24: 4 mg via INTRAVENOUS

## 2017-04-24 MED ORDER — OXYCODONE-ACETAMINOPHEN 5-325 MG PO TABS
ORAL_TABLET | ORAL | Status: AC
  Filled 2017-04-24: qty 1

## 2017-04-24 MED ORDER — NAPROXEN SODIUM 550 MG PO TABS: 550.0000 mg | ORAL_TABLET | Freq: Two times a day (BID) | ORAL | 0 refills | Status: DC

## 2017-04-24 MED ORDER — SODIUM CHLORIDE 0.9 % IV BOLUS (SEPSIS)
1000.0000 mL | Freq: Once | INTRAVENOUS | Status: AC
  Administered 2017-04-24: 1000 mL via INTRAVENOUS

## 2017-04-24 MED ORDER — ONDANSETRON HCL 4 MG/2ML IJ SOLN
INTRAMUSCULAR | Status: AC
  Filled 2017-04-24: qty 2

## 2017-04-24 MED ORDER — TAMSULOSIN HCL 0.4 MG PO CAPS
0.4000 mg | ORAL_CAPSULE | ORAL | Status: AC
  Administered 2017-04-24: 0.4 mg via ORAL

## 2017-04-24 MED ORDER — OXYCODONE-ACETAMINOPHEN 5-325 MG PO TABS: 1.0000 | ORAL_TABLET | Freq: Four times a day (QID) | ORAL | 0 refills | Status: DC | PRN

## 2017-04-24 MED ORDER — TAMSULOSIN HCL 0.4 MG PO CAPS
ORAL_CAPSULE | ORAL | Status: AC
  Filled 2017-04-24: qty 1

## 2017-04-24 MED ORDER — TAMSULOSIN HCL 0.4 MG PO CAPS: 0.4000 mg | ORAL_CAPSULE | Freq: Every day | ORAL | 0 refills | Status: DC

## 2017-04-24 MED ORDER — KETOROLAC TROMETHAMINE 30 MG/ML IJ SOLN
INTRAMUSCULAR | Status: DC
Start: 2017-04-24 — End: 2017-04-24
  Filled 2017-04-24: qty 1

## 2017-04-24 MED ORDER — KETOROLAC TROMETHAMINE 30 MG/ML IJ SOLN
15.0000 mg | INTRAMUSCULAR | Status: AC
  Administered 2017-04-24: 15 mg via INTRAVENOUS

## 2017-04-24 NOTE — Discharge Instructions (Signed)
Results for orders placed or performed during the hospital encounter of 04/24/17  Urinalysis, Complete w Microscopic  Result Value Ref Range   Color, Urine AMBER (A) YELLOW   APPearance HAZY (A) CLEAR   Specific Gravity, Urine 1.025 1.005 - 1.030   pH 6.0 5.0 - 8.0   Glucose, UA NEGATIVE NEGATIVE mg/dL   Hgb urine dipstick MODERATE (A) NEGATIVE   Bilirubin Urine NEGATIVE NEGATIVE   Ketones, ur NEGATIVE NEGATIVE mg/dL   Protein, ur NEGATIVE NEGATIVE mg/dL   Nitrite NEGATIVE NEGATIVE   Leukocytes, UA NEGATIVE NEGATIVE   RBC / HPF TOO NUMEROUS TO COUNT 0 - 5 RBC/hpf   WBC, UA 0-5 0 - 5 WBC/hpf   Bacteria, UA NONE SEEN NONE SEEN   Squamous Epithelial / LPF 0-5 (A) NONE SEEN   Mucous PRESENT   Basic metabolic panel  Result Value Ref Range   Sodium 138 135 - 145 mmol/L   Potassium 4.6 3.5 - 5.1 mmol/L   Chloride 103 101 - 111 mmol/L   CO2 26 22 - 32 mmol/L   Glucose, Bld 127 (H) 65 - 99 mg/dL   BUN 16 6 - 20 mg/dL   Creatinine, Ser 1.16 0.61 - 1.24 mg/dL   Calcium 9.7 8.9 - 10.3 mg/dL   GFR calc non Af Amer >60 >60 mL/min   GFR calc Af Amer >60 >60 mL/min   Anion gap 9 5 - 15  CBC  Result Value Ref Range   WBC 9.3 3.8 - 10.6 K/uL   RBC 5.47 4.40 - 5.90 MIL/uL   Hemoglobin 15.7 13.0 - 18.0 g/dL   HCT 45.6 40.0 - 52.0 %   MCV 83.3 80.0 - 100.0 fL   MCH 28.7 26.0 - 34.0 pg   MCHC 34.5 32.0 - 36.0 g/dL   RDW 13.7 11.5 - 14.5 %   Platelets 223 150 - 440 K/uL   Ct Renal Stone Study  Result Date: 04/24/2017 CLINICAL DATA:  Left flank pain starting yesterday. EXAM: CT ABDOMEN AND PELVIS WITHOUT CONTRAST TECHNIQUE: Multidetector CT imaging of the abdomen and pelvis was performed following the standard protocol without IV contrast. COMPARISON:  None. FINDINGS: Lower chest: No acute abnormality. Hepatobiliary: There is diffuse low density of the liver consistent with fatty infiltration. No focal liver lesion is identified. The gallbladder and biliary tree are normal. Pancreas:  Unremarkable. No pancreatic ductal dilatation or surrounding inflammatory changes. Spleen: Unremarkable. Adrenals/Urinary Tract: There is left hydronephrosis due to obstruction by a 4.5 mm stone in proximal left ureter. There is a 1 mm nonobstructing stone in the mid to lower pole left kidney. There is no right hydronephrosis or right renal stone. The adrenal glands are normal. The bladder is partially decompressed without gross abnormality. Stomach/Bowel: Stomach is within normal limits. The patient status post prior appendectomy. No evidence of bowel wall thickening, distention, or inflammatory changes. Vascular/Lymphatic: No significant vascular findings are present. No enlarged abdominal or pelvic lymph nodes. Reproductive: Prostate calcifications are identified. Other: There is no ascites. Minimal umbilical herniation of mesenteric fat is identified. Musculoskeletal: Mild degenerative joint changes of the spine are identified. IMPRESSION: Left hydroureteronephrosis due to obstruction by 4.5 mm stone in the proximal left ureter. Nonobstructing left kidney stone. Fatty infiltration of liver. Electronically Signed   By: Abelardo Diesel M.D.   On: 04/24/2017 16:50

## 2017-04-24 NOTE — ED Triage Notes (Signed)
Pt reports left flank pain that began yesterday. Denies dysuria. Pt reports urine is dark. Denies NVD.

## 2017-04-24 NOTE — ED Provider Notes (Signed)
[facility] Emergency Department Provider Note  ____________________________________________  Time seen: Approximately 6:08 PM  I have reviewed the triage vital signs and the nursing notes.   HISTORY  Chief Complaint Flank Pain    HPI Lance Decker is a 51 y.o. male who complains of sudden onset of left flank pain yesterday. Colicky, constant but waxing and waning. Currently severe sharp in the left flank radiating around to the left groin. No aggravating or alleviating factors. Never had anything like this before. Denies history of kidney stones.     Past Medical History:  Diagnosis Date  . Diabetes mellitus without complication ([place])   . GERD (gastroesophageal reflux disease)   . H/O alcohol abuse    none since 1992  . MI (myocardial infarction) ([place]) 2002  . Sleep apnea      Patient Active Problem List   Diagnosis Date Noted  . Pain in the chest 03/02/2015  . OSA treated with BiPAP 03/01/2015  . Diabetes mellitus, type 2 ([place]) 03/01/2015     Past Surgical History:  Procedure Laterality Date  . ANAL FISSURECTOMY    . APPENDECTOMY    . CARDIAC CATHETERIZATION Left 03/02/2015   Procedure: Left Heart Cath and Coronary Angiography;  Surgeon: Yolonda Kida, MD;  Location: [facility];  Service: Cardiovascular;  Laterality: Left;  . PILONIDAL CYST / SINUS EXCISION    . PILONIDAL CYST EXCISION N/A 05/12/2015   Procedure: CYST EXCISION PILONIDAL EXTENSIVE;  Surgeon: Leonie Green, MD;  Location: [facility] OR[facility];  Service: General;  Laterality: N/A;     Prior to Admission medications   Medication Sig Start Date End Date Taking? Authorizing Provider  aspirin 81 MG tablet Take 81 mg by mouth daily.    [provider]  Fluticasone-Salmeterol (ADVAIR DISKUS) 250-50 MCG/DOSE AEPB Inhale 1 puff into the lungs 2 (two) times daily. 12/17/15 12/16/16  Daymon Larsen, MD  HYDROcodone-acetaminophen (NORCO) 5-325 MG per tablet Take 1-2  tablets by mouth every 4 (four) hours as needed for moderate pain. 05/12/15   Leonie Green, MD  metFORMIN (GLUCOPHAGE) 500 MG tablet Take 500 mg by mouth 2 (two) times daily with a meal.    [provider]  Multiple Vitamin (MULTIVITAMIN) tablet Take 1 tablet by mouth daily.    [provider]  naproxen sodium (ANAPROX) 550 MG tablet Take 1 tablet (550 mg total) by mouth 2 (two) times daily with a meal. 04/24/17   Carrie Mew, MD  omeprazole (PRILOSEC) 20 MG capsule Take 1 capsule (20 mg total) by mouth 2 (two) times daily before a meal. X 7 days Patient taking differently: Take 20 mg by mouth as needed. X 7 days 03/02/15   Tama High III, MD  ondansetron (ZOFRAN ODT) 4 MG disintegrating tablet Take 1 tablet (4 mg total) by mouth every 8 (eight) hours as needed for nausea or vomiting. 04/24/17   Carrie Mew, MD  oxyCODONE-acetaminophen (ROXICET) 5-325 MG tablet Take 1 tablet by mouth every 6 (six) hours as needed for severe pain. 04/24/17   Carrie Mew, MD  tamsulosin (FLOMAX) 0.4 MG CAPS capsule Take 1 capsule (0.4 mg total) by mouth daily. 04/24/17   Carrie Mew, MD     Allergies Penicillins   Family History  Problem Relation Age of Onset  . Heart disease Father   . Diabetes Mother   . Diabetes Maternal Grandfather   . Diabetes Maternal Grandmother     Social History Social History  Substance Use Topics  . Smoking status: Never Smoker  . Smokeless tobacco: Current User    Types: Snuff  . Alcohol use No    Review of Systems  Constitutional:   No fever or chills.  ENT:   No sore throat. No rhinorrhea. Cardiovascular:   No chest pain or syncope. Respiratory:   No dyspnea or cough. Gastrointestinal:  Left flank pain as above without vomiting and diarrhea.  Musculoskeletal:   Negative for focal pain or swelling All other systems reviewed and are negative except as documented above in ROS and  HPI.  ____________________________________________   PHYSICAL EXAM:  VITAL SIGNS: ED Triage Vitals [04/24/17 1530]  Enc Vitals Group     BP 127/79     Pulse Rate 74     Resp 18     Temp (!) 97.5 F (36.4 C)     Temp Source Oral     SpO2 95 %     Weight (!) 321 lb (145.6 kg)     Height 6' (1.829 m)     Head Circumference      Peak Flow      Pain Score 10     Pain Loc      Pain Edu?      Excl. in [place]?     Vital signs reviewed, nursing assessments reviewed.   Constitutional:   Alert and oriented. Very uncomfortable but not in distress. Eyes:   No scleral icterus.  EOMI. No nystagmus. No conjunctival pallor. PERRL. ENT   Head:   Normocephalic and atraumatic.   Nose:   No congestion/rhinnorhea.    Mouth/Throat:   MMM, no pharyngeal erythema. No peritonsillar mass.    Neck:   No meningismus. Full ROM Hematological/Lymphatic/Immunilogical:   No cervical lymphadenopathy. Cardiovascular:   RRR. Symmetric bilateral radial and DP pulses.  No murmurs.  Respiratory:   Normal respiratory effort without tachypnea/retractions. Breath sounds are clear and equal bilaterally. No wheezes/rales/rhonchi. Gastrointestinal:   Soft and nontender. Non distended. There is no CVA tenderness.  No rebound, rigidity, or guarding. Genitourinary:   deferred Musculoskeletal:   Normal range of motion in all extremities. No joint effusions.  No lower extremity tenderness.  No edema. Neurologic:   Normal speech and language.  Motor grossly intact. No gross focal neurologic deficits are appreciated.  Skin:    Skin is warm, dry and intact. No rash noted.  No petechiae, purpura, or bullae.  ____________________________________________    LABS (pertinent positives/negatives) (all labs ordered are listed, but only abnormal results are displayed) Labs Reviewed  URINALYSIS, COMPLETE (UACMP) WITH MICROSCOPIC - Abnormal; Notable for the following:       Result Value   Color, Urine AMBER (*)     APPearance HAZY (*)    Hgb urine dipstick MODERATE (*)    Squamous Epithelial / LPF 0-5 (*)    All other components within normal limits  BASIC METABOLIC PANEL - Abnormal; Notable for the following:    Glucose, Bld 127 (*)    All other components within normal limits  CBC   ____________________________________________   EKG    ____________________________________________    RADIOLOGY  Ct Renal Stone Study  Result Date: 04/24/2017 CLINICAL DATA:  Left flank pain starting yesterday. EXAM: CT ABDOMEN AND PELVIS WITHOUT CONTRAST TECHNIQUE: Multidetector CT imaging of the abdomen and pelvis was performed following the standard protocol without IV contrast. COMPARISON:  None. FINDINGS: Lower chest: No acute abnormality. Hepatobiliary: There is diffuse low density of the liver consistent  with fatty infiltration. No focal liver lesion is identified. The gallbladder and biliary tree are normal. Pancreas: Unremarkable. No pancreatic ductal dilatation or surrounding inflammatory changes. Spleen: Unremarkable. Adrenals/Urinary Tract: There is left hydronephrosis due to obstruction by a 4.5 mm stone in proximal left ureter. There is a 1 mm nonobstructing stone in the mid to lower pole left kidney. There is no right hydronephrosis or right renal stone. The adrenal glands are normal. The bladder is partially decompressed without gross abnormality. Stomach/Bowel: Stomach is within normal limits. The patient status post prior appendectomy. No evidence of bowel wall thickening, distention, or inflammatory changes. Vascular/Lymphatic: No significant vascular findings are present. No enlarged abdominal or pelvic lymph nodes. Reproductive: Prostate calcifications are identified. Other: There is no ascites. Minimal umbilical herniation of mesenteric fat is identified. Musculoskeletal: Mild degenerative joint changes of the spine are identified. IMPRESSION: Left hydroureteronephrosis due to obstruction by 4.5 mm  stone in the proximal left ureter. Nonobstructing left kidney stone. Fatty infiltration of liver. Electronically Signed   By: Abelardo Diesel M.D.   On: 04/24/2017 16:50    ____________________________________________   PROCEDURES Procedures  ____________________________________________   INITIAL IMPRESSION / ASSESSMENT AND PLAN / ED COURSE  Pertinent labs & imaging results that were available during my care of the patient were reviewed by me and considered in my medical decision making (see chart for details).  Patient presents with left flank pain radiating around to the groin, consistent with renal colic. CT shows a 4.5 mm proximal ureteral stone expanding his pain. No other acute findings. Vitals are unremarkable. Pain is controlled with Toradol. Patient also given oral Percocet for further pain control. Counseled the patient extensively on what to expect. Discharged with naproxen and Percocet and Zofran and Flomax, follow-up with primary care and urology. Counseled on avoiding driving or dangerous machinery with the Percocet, work note provided.Considering the patient's symptoms, medical history, and physical examination today, I have low suspicion for cholecystitis or biliary pathology, pancreatitis, perforation or bowel obstruction, hernia, intra-abdominal abscess, AAA or dissection, volvulus or intussusception, mesenteric ischemia, or appendicitis.        ____________________________________________   FINAL CLINICAL IMPRESSION(S) / ED DIAGNOSES  Final diagnoses:  Ureteral colic  Hematuria, unspecified type  Left flank pain      New Prescriptions   NAPROXEN SODIUM (ANAPROX) 550 MG TABLET    Take 1 tablet (550 mg total) by mouth 2 (two) times daily with a meal.   ONDANSETRON (ZOFRAN ODT) 4 MG DISINTEGRATING TABLET    Take 1 tablet (4 mg total) by mouth every 8 (eight) hours as needed for nausea or vomiting.   OXYCODONE-ACETAMINOPHEN (ROXICET) 5-325 MG TABLET    Take 1  tablet by mouth every 6 (six) hours as needed for severe pain.   TAMSULOSIN (FLOMAX) 0.4 MG CAPS CAPSULE    Take 1 capsule (0.4 mg total) by mouth daily.     Portions of this note were generated with dragon dictation software. Dictation errors may occur despite best attempts at proofreading.    Carrie Mew, MD 04/24/17 [phone]

## 2017-04-26 ENCOUNTER — Encounter: Payer: Self-pay | Admitting: Urology

## 2017-04-26 ENCOUNTER — Ambulatory Visit
Admission: RE | Admit: 2017-04-26 | Discharge: 2017-04-26 | Disposition: A | Discharge status: Home / Self Care | Payer: Managed Care, Other (non HMO) | Source: Ambulatory Visit | Attending: Urology | Admitting: Urology

## 2017-04-26 ENCOUNTER — Ambulatory Visit (INDEPENDENT_AMBULATORY_CARE_PROVIDER_SITE_OTHER): Payer: Managed Care, Other (non HMO) | Admitting: Urology

## 2017-04-26 VITALS — BP 110/69 | HR 82 | Ht 72.0 in | Wt 321.0 lb

## 2017-04-26 DIAGNOSIS — R109 Unspecified abdominal pain: Secondary | ICD-10-CM | POA: Diagnosis not present

## 2017-04-26 DIAGNOSIS — N201 Calculus of ureter: Secondary | ICD-10-CM

## 2017-04-26 DIAGNOSIS — N200 Calculus of kidney: Secondary | ICD-10-CM

## 2017-04-26 LAB — MICROSCOPIC EXAMINATION: Bacteria, UA: NONE SEEN

## 2017-04-26 LAB — URINALYSIS, COMPLETE
BILIRUBIN UA: NEGATIVE
Glucose, UA: NEGATIVE
KETONES UA: NEGATIVE
Leukocytes, UA: NEGATIVE
Nitrite, UA: NEGATIVE
Protein, UA: NEGATIVE
SPEC GRAV UA: 1.02 (ref 1.005–1.030)
Urobilinogen, Ur: 0.2 mg/dL (ref 0.2–1.0)
pH, UA: 5.5 (ref 5.0–7.5)

## 2017-04-26 MED ORDER — OXYCODONE-ACETAMINOPHEN 5-325 MG PO TABS: 1.0000 | ORAL_TABLET | Freq: Four times a day (QID) | ORAL | 0 refills | Status: DC | PRN

## 2017-04-26 NOTE — Progress Notes (Signed)
04/26/2017 10:00 AM   Lance Decker May 01, 1966 [id number]  Referring provider: Adin Hector, MD [facility] [place], [place] [address]  Chief Complaint  Patient presents with  . Nephrolithiasis    New patient    HPI: 51 year old male who presents today for further evaluation after recent emergency room visit for an obstructing left ureteral calculus.  He presented to the emergency room on 04/24/2017 with severe sudden onset left flank pain found to have a 4.5 mm left proximal ureteral calculus with hydroureteronephrosis. In the emergency room. He had no leukocytosis, creatinine 1.16, urinalysis was unremarkable other than for microscopic blood. His pain ultimately able to be controlled and he was discharged with urologic follow-up.  Today, he continues to have intermittent severe left flank pain. He's been taking narcotics on a regular basis over the past 2 days. His pain has migrated somewhat to the left lower abdomen radiating to his left testicle. He does have some associated nausea. No fevers or chills.  He has been drinking copious amounts of water since his ER visit. He has not been straining his urine consistently.  Prior to this, he has no history of kidney stones.  He does have a significant cardiac history with a history of MI managed medically with 81 mg of aspirin. He sees Dr.Callwood.     PMH: Past Medical History:  Diagnosis Date  . Diabetes mellitus without complication ([place])   . GERD (gastroesophageal reflux disease)   . H/O alcohol abuse    none since 1992  . MI (myocardial infarction) ([place]) 2002  . Sleep apnea     Surgical History: Past Surgical History:  Procedure Laterality Date  . ANAL FISSURECTOMY    . APPENDECTOMY    . CARDIAC CATHETERIZATION Left 03/02/2015   Procedure: Left Heart Cath and Coronary Angiography;  Surgeon: Yolonda Kida, MD;  Location: [facility];  Service: Cardiovascular;   Laterality: Left;  . PILONIDAL CYST / SINUS EXCISION    . PILONIDAL CYST EXCISION N/A 05/12/2015   Procedure: CYST EXCISION PILONIDAL EXTENSIVE;  Surgeon: Leonie Green, MD;  Location: [facility];  Service: General;  Laterality: N/A;    Home Medications:  Allergies as of 04/26/2017      Reactions   Penicillins Anaphylaxis      Medication List       Accurate as of 04/26/17 10:00 AM. Always use your most recent med list.          aspirin 81 MG tablet Take 81 mg by mouth daily.   Fluticasone-Salmeterol 250-50 MCG/DOSE Aepb Commonly known as:  ADVAIR DISKUS Inhale 1 puff into the lungs 2 (two) times daily.   HYDROcodone-acetaminophen 5-325 MG tablet Commonly known as:  NORCO Take 1-2 tablets by mouth every 4 (four) hours as needed for moderate pain.   metFORMIN 500 MG tablet Commonly known as:  GLUCOPHAGE Take 500 mg by mouth 2 (two) times daily with a meal.   multivitamin tablet Take 1 tablet by mouth daily.   naproxen sodium 550 MG tablet Commonly known as:  ANAPROX Take 1 tablet (550 mg total) by mouth 2 (two) times daily with a meal.   omeprazole 20 MG capsule Commonly known as:  PRILOSEC Take 1 capsule (20 mg total) by mouth 2 (two) times daily before a meal. X 7 days   ondansetron 4 MG disintegrating tablet Commonly known as:  ZOFRAN ODT Take 1 tablet (4 mg total) by mouth every 8 (  eight) hours as needed for nausea or vomiting.   oxyCODONE-acetaminophen 5-325 MG tablet Commonly known as:  ROXICET Take 1-2 tablets by mouth every 6 (six) hours as needed for severe pain.   tamsulosin 0.4 MG Caps capsule Commonly known as:  FLOMAX Take 1 capsule (0.4 mg total) by mouth daily.       Allergies:  Allergies  Allergen Reactions  . Penicillins Anaphylaxis    Family History: Family History  Problem Relation Age of Onset  . Heart disease Father   . Diabetes Mother   . Diabetes Maternal Grandfather   . Diabetes Maternal Grandmother   . Prostate cancer  Neg Hx   . Kidney cancer Neg Hx     Social History:  reports that he has never smoked. His smokeless tobacco use includes Snuff. He reports that he does not drink alcohol or use drugs.  ROS: UROLOGY Frequent Urination?: No Hard to postpone urination?: No Burning/pain with urination?: No Get up at night to urinate?: No Leakage of urine?: No Urine stream starts and stops?: Yes Trouble starting stream?: No Do you have to strain to urinate?: Yes Blood in urine?: No Urinary tract infection?: No Sexually transmitted disease?: No Injury to kidneys or bladder?: No Painful intercourse?: No Weak stream?: No Erection problems?: No Penile pain?: No  Gastrointestinal Nausea?: Yes Vomiting?: No Indigestion/heartburn?: No Diarrhea?: No Constipation?: Yes  Constitutional Fever: No Night sweats?: No Weight loss?: No Fatigue?: No  Skin Skin rash/lesions?: No Itching?: No  Eyes Blurred vision?: No Double vision?: No  Ears/Nose/Throat Sore throat?: No Sinus problems?: No  Hematologic/Lymphatic Swollen glands?: No Easy bruising?: No  Cardiovascular Leg swelling?: No Chest pain?: No  Respiratory Cough?: No Shortness of breath?: No  Endocrine Excessive thirst?: No  Musculoskeletal Back pain?: No Joint pain?: No  Neurological Headaches?: No Dizziness?: No  Psychologic Depression?: No Anxiety?: No  Physical Exam: BP 110/69   Pulse 82   Ht 6' (1.829 m)   Wt (!) 321 lb (145.6 kg)   BMI 43.54 kg/m   Constitutional:  Alert and oriented, No acute distress.  Accompanied by ? Wife. HEENT: [place] AT, moist mucus membranes.  Trachea midline, no masses. Cardiovascular: No clubbing, cyanosis, or edema. Respiratory: Normal respiratory effort, no increased work of breathing. GI: Abdomen is obese, mild left lower quadrant tenderness.  No rebound or guarding. GU: No CVA tenderness.  Skin: No rashes, bruises or suspicious lesions. Neurologic: Grossly intact, no focal  deficits, moving all 4 extremities. Psychiatric: Normal mood and affect.  Laboratory Data: Lab Results  Component Value Date   WBC 9.3 04/24/2017   HGB 15.7 04/24/2017   HCT 45.6 04/24/2017   MCV 83.3 04/24/2017   PLT 223 04/24/2017    Lab Results  Component Value Date   CREATININE 1.16 04/24/2017    Urinalysis    Component Value Date/Time   COLORURINE AMBER (A) 04/24/2017 1533   APPEARANCEUR HAZY (A) 04/24/2017 1533   LABSPEC 1.025 04/24/2017 1533   PHURINE 6.0 04/24/2017 1533   GLUCOSEU NEGATIVE 04/24/2017 1533   HGBUR MODERATE (A) 04/24/2017 1533   BILIRUBINUR NEGATIVE 04/24/2017 1533   KETONESUR NEGATIVE 04/24/2017 1533   PROTEINUR NEGATIVE 04/24/2017 1533   NITRITE NEGATIVE 04/24/2017 1533   LEUKOCYTESUR NEGATIVE 04/24/2017 1533    Pertinent Imaging: CLINICAL DATA:  Left flank pain starting yesterday.  EXAM: CT ABDOMEN AND PELVIS WITHOUT CONTRAST  TECHNIQUE: Multidetector CT imaging of the abdomen and pelvis was performed following the standard protocol without IV contrast.  COMPARISON:  None.  FINDINGS: Lower chest: No acute abnormality.  Hepatobiliary: There is diffuse low density of the liver consistent with fatty infiltration. No focal liver lesion is identified. The gallbladder and biliary tree are normal.  Pancreas: Unremarkable. No pancreatic ductal dilatation or surrounding inflammatory changes.  Spleen: Unremarkable.  Adrenals/Urinary Tract: There is left hydronephrosis due to obstruction by a 4.5 mm stone in proximal left ureter. There is a 1 mm nonobstructing stone in the mid to lower pole left kidney. There is no right hydronephrosis or right renal stone. The adrenal glands are normal. The bladder is partially decompressed without gross abnormality.  Stomach/Bowel: Stomach is within normal limits. The patient status post prior appendectomy. No evidence of bowel wall thickening, distention, or inflammatory  changes.  Vascular/Lymphatic: No significant vascular findings are present. No enlarged abdominal or pelvic lymph nodes.  Reproductive: Prostate calcifications are identified.  Other: There is no ascites. Minimal umbilical herniation of mesenteric fat is identified.  Musculoskeletal: Mild degenerative joint changes of the spine are identified.  IMPRESSION: Left hydroureteronephrosis due to obstruction by 4.5 mm stone in the proximal left ureter.  Nonobstructing left kidney stone.  Fatty infiltration of liver.   Electronically Signed   By: Abelardo Diesel M.D.   On: 04/24/2017 16:50   Assessment & Plan:    1. Left ureteral stone 4.5 mm left proximal ureteral stone with possible punctate second stone adjacent to this.  Based on the size of the stone and pain which appears to be migrating distally, he is an excellent chance of passing this stone spontaneously with medical expulsive therapy. We discussed risks and benefits of this in detail.  We discussed various treatment options including ESWL vs. ureteroscopy, laser lithotripsy, and stent. We discussed the risks and benefits of both including bleeding, infection, damage to surrounding structures, efficacy with need for possible further intervention, and need for temporary ureteral stent.  At this point in time, he is most interested in shockwave lithotripsy to be scheduled for next week. In the interim, he will continue to strain his urine and additional narcotics in the form of Percocet were given today to help him with pain control. Warning symptoms are reviewed.  If he passes this stone prior to the scheduled procedure, he will let us know and we'll cancel.  KUB today to assess whether or not stone is visible which would preclude our plan for shockwave lithotripsy.  He'll need to stop his aspirin 72 hours prior to the procedure, we'll obtain clearance from Dr. Clayborn Bigness  for this.  - Urinalysis, Complete - Abdomen 1  view (KUB); Future  2. Left flank pain As per #1  3. Kidney stone on left side Punctate left lower pole stone, recommend observation for this    Hollice Espy, MD  [facility] [address], [place], [place] [address] [phone]

## 2017-04-27 ENCOUNTER — Emergency Department: Payer: Managed Care, Other (non HMO)

## 2017-04-27 ENCOUNTER — Emergency Department
Admission: EM | Admit: 2017-04-27 | Discharge: 2017-04-27 | Disposition: A | Discharge status: Home / Self Care | Payer: Managed Care, Other (non HMO) | Attending: Emergency Medicine | Admitting: Emergency Medicine

## 2017-04-27 ENCOUNTER — Encounter: Payer: Self-pay | Admitting: Emergency Medicine

## 2017-04-27 ENCOUNTER — Telehealth: Payer: Self-pay

## 2017-04-27 ENCOUNTER — Ambulatory Visit: Payer: Self-pay | Admitting: Urology

## 2017-04-27 DIAGNOSIS — F17290 Nicotine dependence, other tobacco product, uncomplicated: Secondary | ICD-10-CM | POA: Diagnosis not present

## 2017-04-27 DIAGNOSIS — Z79899 Other long term (current) drug therapy: Secondary | ICD-10-CM | POA: Insufficient documentation

## 2017-04-27 DIAGNOSIS — E119 Type 2 diabetes mellitus without complications: Secondary | ICD-10-CM | POA: Diagnosis not present

## 2017-04-27 DIAGNOSIS — K5900 Constipation, unspecified: Secondary | ICD-10-CM | POA: Diagnosis present

## 2017-04-27 DIAGNOSIS — I252 Old myocardial infarction: Secondary | ICD-10-CM | POA: Diagnosis not present

## 2017-04-27 DIAGNOSIS — Z7982 Long term (current) use of aspirin: Secondary | ICD-10-CM | POA: Diagnosis not present

## 2017-04-27 DIAGNOSIS — N200 Calculus of kidney: Secondary | ICD-10-CM | POA: Insufficient documentation

## 2017-04-27 DIAGNOSIS — Z7984 Long term (current) use of oral hypoglycemic drugs: Secondary | ICD-10-CM | POA: Diagnosis not present

## 2017-04-27 LAB — COMPREHENSIVE METABOLIC PANEL
ALBUMIN: 4 g/dL (ref 3.5–5.0)
ALT: 25 U/L (ref 17–63)
ANION GAP: 10 (ref 5–15)
AST: 19 U/L (ref 15–41)
Alkaline Phosphatase: 53 U/L (ref 38–126)
BILIRUBIN TOTAL: 1.6 mg/dL — AB (ref 0.3–1.2)
BUN: 16 mg/dL (ref 6–20)
CHLORIDE: 101 mmol/L (ref 101–111)
CO2: 26 mmol/L (ref 22–32)
Calcium: 8.9 mg/dL (ref 8.9–10.3)
Creatinine, Ser: 1.11 mg/dL (ref 0.61–1.24)
GFR calc Af Amer: >60 mL/min (ref >60)
Glucose, Bld: 108 mg/dL — ABNORMAL HIGH (ref 65–99)
POTASSIUM: 4.6 mmol/L (ref 3.5–5.1)
Sodium: 137 mmol/L (ref 135–145)
TOTAL PROTEIN: 7.4 g/dL (ref 6.5–8.1)

## 2017-04-27 LAB — URINALYSIS, COMPLETE (UACMP) WITH MICROSCOPIC
BILIRUBIN URINE: NEGATIVE
Bacteria, UA: NONE SEEN
GLUCOSE, UA: NEGATIVE mg/dL
KETONES UR: 5 mg/dL — AB
LEUKOCYTES UA: NEGATIVE
Nitrite: NEGATIVE
PH: 5 (ref 5.0–8.0)
Protein, ur: NEGATIVE mg/dL
Specific Gravity, Urine: 1.014 (ref 1.005–1.030)
Squamous Epithelial / LPF: NONE SEEN

## 2017-04-27 LAB — CBC WITH DIFFERENTIAL/PLATELET
BASOS PCT: 0 %
Basophils Absolute: 0.1 10*3/uL (ref 0–0.1)
EOS PCT: 1 %
Eosinophils Absolute: 0.1 10*3/uL (ref 0–0.7)
HEMATOCRIT: 39.8 % — AB (ref 40.0–52.0)
Hemoglobin: 13.8 g/dL (ref 13.0–18.0)
Lymphocytes Relative: 11 %
Lymphs Abs: 1.2 10*3/uL (ref 1.0–3.6)
MCH: 29 pg (ref 26.0–34.0)
MCHC: 34.8 g/dL (ref 32.0–36.0)
MCV: 83.3 fL (ref 80.0–100.0)
MONO ABS: 1.1 10*3/uL — AB (ref 0.2–1.0)
MONOS PCT: 10 %
NEUTROS ABS: 8.8 10*3/uL — AB (ref 1.4–6.5)
Neutrophils Relative %: 78 %
PLATELETS: 202 10*3/uL (ref 150–440)
RBC: 4.77 MIL/uL (ref 4.40–5.90)
RDW: 13.5 % (ref 11.5–14.5)
WBC: 11.3 10*3/uL — ABNORMAL HIGH (ref 3.8–10.6)

## 2017-04-27 MED ORDER — LACTULOSE 10 GM/15ML PO SOLN
30.0000 g | Freq: Once | ORAL | Status: AC
  Administered 2017-04-27: 30 g via ORAL
  Filled 2017-04-27: qty 60

## 2017-04-27 MED ORDER — SORBITOL 70 % SOLN
960.0000 mL | TOPICAL_OIL | Freq: Once | ORAL | Status: AC
  Administered 2017-04-27: 960 mL via RECTAL
  Filled 2017-04-27 (×4): qty 240

## 2017-04-27 MED ORDER — HYDROMORPHONE HCL 1 MG/ML IJ SOLN
1.0000 mg | Freq: Once | INTRAMUSCULAR | Status: AC
  Administered 2017-04-27: 1 mg via INTRAVENOUS
  Filled 2017-04-27: qty 1

## 2017-04-27 MED ORDER — LACTULOSE 10 GM/15ML PO SOLN: 20.0000 g | Freq: Every day | ORAL | 0 refills | Status: DC | PRN

## 2017-04-27 MED ORDER — ONDANSETRON HCL 4 MG/2ML IJ SOLN
4.0000 mg | Freq: Once | INTRAMUSCULAR | Status: AC
  Administered 2017-04-27: 4 mg via INTRAVENOUS
  Filled 2017-04-27: qty 2

## 2017-04-27 MED ORDER — SODIUM CHLORIDE 0.9 % IV BOLUS (SEPSIS)
1000.0000 mL | Freq: Once | INTRAVENOUS | Status: AC
  Administered 2017-04-27: 1000 mL via INTRAVENOUS

## 2017-04-27 MED ORDER — IOPAMIDOL (ISOVUE-300) INJECTION 61%: 30.0000 mL | Freq: Once | INTRAVENOUS | Status: DC | PRN

## 2017-04-27 NOTE — ED Provider Notes (Signed)
[facility] Provider Note   CSN: [id number] Arrival date & time: 04/27/17  1418     History   Chief Complaint Chief Complaint  Patient presents with  . Constipation    HPI Lance Decker is a 51 y.o. male history of diabetes, reflux, MI here presenting with abdominal pain, constipation. Patient states that he is constipated at baseline. He was diagnosed with kidney stone 4 days ago. Patient was prescribed Percocet and Zofran. Patient states that he had no bowel movement since his ED visit several days ago. He has been using enema, MiraLAX, stool softeners, suppositories with no relief. Patient went to see urologist yesterday and had a KUB that showed constipation and the stone has not moved. Patient is scheduled for lithotripsy next week. He is here because of constipation, worsening pain. Denies vomiting or fevers.      The history is provided by the patient.    Past Medical History:  Diagnosis Date  . Diabetes mellitus without complication ([place])   . GERD (gastroesophageal reflux disease)   . H/O alcohol abuse    none since 1992  . MI (myocardial infarction) ([place]) 2002  . Sleep apnea     Patient Active Problem List   Diagnosis Date Noted  . BMI 40.0-44.9, adult ([place]) 01/25/2017  . Elevated liver enzymes 04/05/2016  . Pain in the chest 03/02/2015  . OSA treated with BiPAP 03/01/2015  . Diabetes mellitus, type 2 ([place]) 03/01/2015  . Diabetes mellitus without complication ([place]) 42/70/6237  . Hypogonadism male 08/05/2014    Past Surgical History:  Procedure Laterality Date  . ANAL FISSURECTOMY    . APPENDECTOMY    . CARDIAC CATHETERIZATION Left 03/02/2015   Procedure: Left Heart Cath and Coronary Angiography;  Surgeon: Yolonda Kida, MD;  Location: [facility];  Service: Cardiovascular;  Laterality: Left;  . PILONIDAL CYST / SINUS EXCISION    . PILONIDAL CYST EXCISION N/A 05/12/2015   Procedure: CYST EXCISION PILONIDAL EXTENSIVE;  Surgeon:  Leonie Green, MD;  Location: [facility];  Service: General;  Laterality: N/A;       Home Medications    Prior to Admission medications   Medication Sig Start Date End Date Taking? Authorizing Provider  aspirin 81 MG tablet Take 81 mg by mouth daily.    [provider]  Fluticasone-Salmeterol (ADVAIR DISKUS) 250-50 MCG/DOSE AEPB Inhale 1 puff into the lungs 2 (two) times daily. 12/17/15 12/16/16  Daymon Larsen, MD  HYDROcodone-acetaminophen (NORCO) 5-325 MG per tablet Take 1-2 tablets by mouth every 4 (four) hours as needed for moderate pain. 05/12/15   Leonie Green, MD  metFORMIN (GLUCOPHAGE) 500 MG tablet Take 500 mg by mouth 2 (two) times daily with a meal.    [provider]  Multiple Vitamin (MULTIVITAMIN) tablet Take 1 tablet by mouth daily.    [provider]  naproxen sodium (ANAPROX) 550 MG tablet Take 1 tablet (550 mg total) by mouth 2 (two) times daily with a meal. 04/24/17   Carrie Mew, MD  omeprazole (PRILOSEC) 20 MG capsule Take 1 capsule (20 mg total) by mouth 2 (two) times daily before a meal. X 7 days Patient taking differently: Take 20 mg by mouth as needed. X 7 days 03/02/15   Tama High III, MD  ondansetron (ZOFRAN ODT) 4 MG disintegrating tablet Take 1 tablet (4 mg total) by mouth every 8 (eight) hours as needed for nausea or vomiting. 04/24/17   Carrie Mew, MD  oxyCODONE-acetaminophen (  ROXICET) 5-325 MG tablet Take 1-2 tablets by mouth every 6 (six) hours as needed for severe pain. 04/26/17   Hollice Espy, MD  tamsulosin (FLOMAX) 0.4 MG CAPS capsule Take 1 capsule (0.4 mg total) by mouth daily. 04/24/17   Carrie Mew, MD    Family History Family History  Problem Relation Age of Onset  . Heart disease Father   . Diabetes Mother   . Diabetes Maternal Grandfather   . Diabetes Maternal Grandmother   . Prostate cancer Neg Hx   . Kidney cancer Neg Hx     Social History Social History  Substance Use Topics  .  Smoking status: Never Smoker  . Smokeless tobacco: Current User    Types: Snuff  . Alcohol use No     Allergies   Penicillins   Review of Systems Review of Systems  Gastrointestinal: Positive for abdominal pain and constipation.  All other systems reviewed and are negative.    Physical Exam Updated Vital Signs BP (!) 142/80 (BP Location: Left Arm)   Pulse 86   Temp 98.7 F (37.1 C) (Oral)   Resp 18   Ht 6' (1.829 m)   Wt (!) 145.6 kg (321 lb)   SpO2 99%   BMI 43.54 kg/m   Physical Exam  Constitutional: He is oriented to person, place, and time. He appears well-developed.  Overweight, uncomfortable   HENT:  Head: Normocephalic.  Mouth/Throat: Oropharynx is clear and moist.  Eyes: Pupils are equal, round, and reactive to light. Conjunctivae and EOM are normal.  Neck: Normal range of motion. Neck supple.  Cardiovascular: Normal rate, regular rhythm and normal heart sounds.   Pulmonary/Chest: Effort normal and breath sounds normal. No respiratory distress. He has no wheezes.  Abdominal: Soft. Bowel sounds are normal. He exhibits no distension. There is no tenderness.  Mild LLQ and RLQ tenderness,.   Genitourinary:  Genitourinary Comments: Rectal- no obvious stool impaction. No obvious anal fissures or perirectal abscess   Musculoskeletal: Normal range of motion.  Neurological: He is alert and oriented to person, place, and time. No cranial nerve deficit. Coordination normal.  Skin: Skin is warm.  Psychiatric: He has a normal mood and affect. His behavior is normal.  Nursing note and vitals reviewed.    ED Treatments / Results  Labs (all labs ordered are listed, but only abnormal results are displayed) Labs Reviewed  CBC WITH DIFFERENTIAL/PLATELET - Abnormal; Notable for the following:       Result Value   WBC 11.3 (*)    HCT 39.8 (*)    Neutro Abs 8.8 (*)    Monocytes Absolute 1.1 (*)    All other components within normal limits  COMPREHENSIVE METABOLIC  PANEL - Abnormal; Notable for the following:    Glucose, Bld 108 (*)    Total Bilirubin 1.6 (*)    All other components within normal limits  URINALYSIS, COMPLETE (UACMP) WITH MICROSCOPIC - Abnormal; Notable for the following:    Color, Urine YELLOW (*)    APPearance CLEAR (*)    Hgb urine dipstick SMALL (*)    Ketones, ur 5 (*)    All other components within normal limits    EKG  EKG Interpretation None       Radiology Abdomen 1 View (kub)  Result Date: 04/26/2017 CLINICAL DATA:  Acute left-sided abdominal pain. EXAM: ABDOMEN - 1 VIEW COMPARISON:  CT scan of April 24, 2017. FINDINGS: The bowel gas pattern is normal. Focal calcification is seen to the left  of the L3-4 disc space which corresponds to ureteral calculus noted on prior CT scan. No other calculi are noted. IMPRESSION: Probable left ureteral calculus is noted which corresponds to abnormality seen on recent CT scan. No evidence of bowel obstruction or ileus. Electronically Signed   By: Marijo Conception, M.D.   On: 04/26/2017 10:43   Dg Abd Acute W/chest  Result Date: 04/27/2017 CLINICAL DATA:  Kidney stones 4 days ago along the left side. Constipation. EXAM: DG ABDOMEN ACUTE W/ 1V CHEST COMPARISON:  None. FINDINGS: There is no evidence of dilated bowel loops or free intraperitoneal air. 5 mm calcification projecting of the left psoas muscle at the level of L4 which may reflect a mid-distal ureteral calculus. Heart size and mediastinal contours are within normal limits. Both lungs are clear. IMPRESSION: 5 mm calcification projecting of the left psoas muscle at the level of L4 which may reflect a mid-distal ureteral calculus. No acute cardiopulmonary disease. Electronically Signed   By: Kathreen Devoid   On: 04/27/2017 15:45    Procedures Procedures (including critical care time)  Medications Ordered in ED Medications  iopamidol (ISOVUE-300) 61 % injection 30 mL (not administered)  sorbitol, milk of mag, mineral oil, glycerin  (SMOG) enema (960 mLs Rectal Given 04/27/17 1617)  lactulose (CHRONULAC) 10 GM/15ML solution 30 g (30 g Oral Given 04/27/17 1554)  sodium chloride 0.9 % bolus 1,000 mL (1,000 mLs Intravenous New Bag/Given 04/27/17 1554)  ondansetron (ZOFRAN) injection 4 mg (4 mg Intravenous Given 04/27/17 1554)  HYDROmorphone (DILAUDID) injection 1 mg (1 mg Intravenous Given 04/27/17 1554)     Initial Impression / Assessment and Plan / ED Course  I have reviewed the triage vital signs and the nursing notes.  Pertinent labs & imaging results that were available during my care of the patient were reviewed by me and considered in my medical decision making (see chart for details).     Lance Decker is a 51 y.o. male here with constipation. Has hx of constipation and he is on narcotics so likely got worse from taking narcotics. Will repeat acute abdominal series. Will get labs. Will try lactulose and SMOG enema.   6:00 PM Xray showed constipation, L kidney stone stable. Had several large bowel movement after enema, lactulose. Will dc home with lactulose, miralax, dulcolax suppositories prn.    Final Clinical Impressions(s) / ED Diagnoses   Final diagnoses:  Constipation    New Prescriptions New Prescriptions   No medications on file     Drenda Freeze, MD 04/27/17 [id number]

## 2017-04-27 NOTE — ED Notes (Signed)
Patient transported to X-ray 

## 2017-04-27 NOTE — Discharge Instructions (Signed)
Take lactulose 30 cc daily until your stools are soft for a week. Stop if you have diarrhea.   Continue miralax 1 packet twice daily.   Use dulcolax suppository daily.   See your doctor. You still have kidney stone and take your percocet as prescribed and get lithotripsy next week.   Stay hydrated.   Return to ER if you have worse abdominal pain, vomiting, fevers, unable to urinate.

## 2017-04-27 NOTE — ED Notes (Signed)
Pt reports that he went to the bathroom and mostly watery substance came out with a few small pieces of stool - he states that he still feels bloated and without relief at this time

## 2017-04-27 NOTE — Telephone Encounter (Signed)
Spoke to spouse. Gave results and instructions. Patient verbalized understanding.

## 2017-04-27 NOTE — Telephone Encounter (Signed)
-----   Message from Hollice Espy, MD sent at 04/26/2017  6:01 PM EDT ----- Your stone was visible today on x-ray.  It appears to be in a similar position. We will plan for shockwave lithotripsy next week if you've not passed your stone.    Hollice Espy, MD

## 2017-04-27 NOTE — ED Triage Notes (Signed)
Pt with hx of recent kidney stone on Monday, pain medication given on Monday, NO BM since Sunday , feeling bloated,  Was seen at urologist yesterday , pt still has not pass the stone.  Pt with increased pain to left flank and back

## 2017-04-28 ENCOUNTER — Emergency Department: Payer: Managed Care, Other (non HMO)

## 2017-04-28 ENCOUNTER — Observation Stay: Payer: Managed Care, Other (non HMO) | Admitting: Anesthesiology

## 2017-04-28 ENCOUNTER — Observation Stay
Admission: EM | Admit: 2017-04-28 | Discharge: 2017-04-29 | Disposition: A | Discharge status: Home / Self Care | Payer: Managed Care, Other (non HMO) | Attending: Internal Medicine | Admitting: Internal Medicine

## 2017-04-28 ENCOUNTER — Encounter: Payer: Self-pay | Admitting: Emergency Medicine

## 2017-04-28 ENCOUNTER — Encounter
Admission: EM | Disposition: A | Discharge status: Home / Self Care | Payer: Self-pay | Source: Home / Self Care | Attending: Emergency Medicine

## 2017-04-28 DIAGNOSIS — I252 Old myocardial infarction: Secondary | ICD-10-CM | POA: Diagnosis not present

## 2017-04-28 DIAGNOSIS — Z7951 Long term (current) use of inhaled steroids: Secondary | ICD-10-CM | POA: Diagnosis not present

## 2017-04-28 DIAGNOSIS — Z6841 Body Mass Index (BMI) 40.0 and over, adult: Secondary | ICD-10-CM | POA: Insufficient documentation

## 2017-04-28 DIAGNOSIS — Z791 Long term (current) use of non-steroidal anti-inflammatories (NSAID): Secondary | ICD-10-CM | POA: Diagnosis not present

## 2017-04-28 DIAGNOSIS — Z880 Allergy status to penicillin: Secondary | ICD-10-CM | POA: Diagnosis not present

## 2017-04-28 DIAGNOSIS — K219 Gastro-esophageal reflux disease without esophagitis: Secondary | ICD-10-CM | POA: Insufficient documentation

## 2017-04-28 DIAGNOSIS — Z79899 Other long term (current) drug therapy: Secondary | ICD-10-CM | POA: Insufficient documentation

## 2017-04-28 DIAGNOSIS — Z9049 Acquired absence of other specified parts of digestive tract: Secondary | ICD-10-CM | POA: Diagnosis not present

## 2017-04-28 DIAGNOSIS — E119 Type 2 diabetes mellitus without complications: Secondary | ICD-10-CM | POA: Insufficient documentation

## 2017-04-28 DIAGNOSIS — N23 Unspecified renal colic: Secondary | ICD-10-CM | POA: Diagnosis not present

## 2017-04-28 DIAGNOSIS — N132 Hydronephrosis with renal and ureteral calculous obstruction: Principal | ICD-10-CM | POA: Insufficient documentation

## 2017-04-28 DIAGNOSIS — K5900 Constipation, unspecified: Secondary | ICD-10-CM | POA: Insufficient documentation

## 2017-04-28 DIAGNOSIS — Z87892 Personal history of anaphylaxis: Secondary | ICD-10-CM | POA: Insufficient documentation

## 2017-04-28 DIAGNOSIS — I2510 Atherosclerotic heart disease of native coronary artery without angina pectoris: Secondary | ICD-10-CM | POA: Diagnosis not present

## 2017-04-28 DIAGNOSIS — G4733 Obstructive sleep apnea (adult) (pediatric): Secondary | ICD-10-CM | POA: Insufficient documentation

## 2017-04-28 DIAGNOSIS — Z8249 Family history of ischemic heart disease and other diseases of the circulatory system: Secondary | ICD-10-CM | POA: Insufficient documentation

## 2017-04-28 DIAGNOSIS — J8410 Pulmonary fibrosis, unspecified: Secondary | ICD-10-CM | POA: Diagnosis not present

## 2017-04-28 DIAGNOSIS — N135 Crossing vessel and stricture of ureter without hydronephrosis: Secondary | ICD-10-CM | POA: Diagnosis not present

## 2017-04-28 DIAGNOSIS — J449 Chronic obstructive pulmonary disease, unspecified: Secondary | ICD-10-CM | POA: Insufficient documentation

## 2017-04-28 DIAGNOSIS — Z7984 Long term (current) use of oral hypoglycemic drugs: Secondary | ICD-10-CM | POA: Diagnosis not present

## 2017-04-28 DIAGNOSIS — R52 Pain, unspecified: Secondary | ICD-10-CM | POA: Diagnosis not present

## 2017-04-28 DIAGNOSIS — Z720 Tobacco use: Secondary | ICD-10-CM | POA: Diagnosis not present

## 2017-04-28 DIAGNOSIS — K760 Fatty (change of) liver, not elsewhere classified: Secondary | ICD-10-CM | POA: Insufficient documentation

## 2017-04-28 DIAGNOSIS — Z9889 Other specified postprocedural states: Secondary | ICD-10-CM | POA: Diagnosis not present

## 2017-04-28 DIAGNOSIS — R112 Nausea with vomiting, unspecified: Secondary | ICD-10-CM | POA: Diagnosis present

## 2017-04-28 DIAGNOSIS — Z833 Family history of diabetes mellitus: Secondary | ICD-10-CM | POA: Insufficient documentation

## 2017-04-28 DIAGNOSIS — K429 Umbilical hernia without obstruction or gangrene: Secondary | ICD-10-CM | POA: Insufficient documentation

## 2017-04-28 DIAGNOSIS — R1084 Generalized abdominal pain: Secondary | ICD-10-CM | POA: Diagnosis present

## 2017-04-28 DIAGNOSIS — Z7982 Long term (current) use of aspirin: Secondary | ICD-10-CM | POA: Diagnosis not present

## 2017-04-28 DIAGNOSIS — N201 Calculus of ureter: Secondary | ICD-10-CM | POA: Diagnosis not present

## 2017-04-28 DIAGNOSIS — N400 Enlarged prostate without lower urinary tract symptoms: Secondary | ICD-10-CM | POA: Diagnosis not present

## 2017-04-28 HISTORY — DX: Atherosclerotic heart disease of native coronary artery without angina pectoris: I25.10

## 2017-04-28 HISTORY — PX: CYSTOSCOPY/URETEROSCOPY/HOLMIUM LASER/STENT PLACEMENT: SHX6546

## 2017-04-28 HISTORY — DX: Obstructive sleep apnea (adult) (pediatric): G47.33

## 2017-04-28 LAB — URINALYSIS, COMPLETE (UACMP) WITH MICROSCOPIC
BILIRUBIN URINE: NEGATIVE
Bacteria, UA: NONE SEEN
Glucose, UA: NEGATIVE mg/dL
KETONES UR: 20 mg/dL — AB
LEUKOCYTES UA: NEGATIVE
Nitrite: NEGATIVE
PH: 6 (ref 5.0–8.0)
Protein, ur: NEGATIVE mg/dL
SQUAMOUS EPITHELIAL / LPF: NONE SEEN
Specific Gravity, Urine: 1.035 — ABNORMAL HIGH (ref 1.005–1.030)

## 2017-04-28 LAB — TSH: TSH: 1.328 u[IU]/mL (ref 0.350–4.500)

## 2017-04-28 LAB — GLUCOSE, CAPILLARY
Glucose-Capillary: 114 mg/dL — ABNORMAL HIGH (ref 65–99)
Glucose-Capillary: 118 mg/dL — ABNORMAL HIGH (ref 65–99)
Glucose-Capillary: 143 mg/dL — ABNORMAL HIGH (ref 65–99)

## 2017-04-28 SURGERY — CYSTOSCOPY/URETEROSCOPY/HOLMIUM LASER/STENT PLACEMENT
Anesthesia: General | Laterality: Left | Wound class: Clean Contaminated

## 2017-04-28 MED ORDER — NAPROXEN 500 MG PO TABS
500.0000 mg | ORAL_TABLET | Freq: Two times a day (BID) | ORAL | Status: DC
  Administered 2017-04-28 (×2): 500 mg via ORAL
  Filled 2017-04-28 (×4): qty 1

## 2017-04-28 MED ORDER — OXYCODONE HCL 5 MG PO TABS
7.5000 mg | ORAL_TABLET | ORAL | Status: DC | PRN
  Administered 2017-04-28 – 2017-04-29 (×5): 7.5 mg via ORAL
  Filled 2017-04-28 (×5): qty 2

## 2017-04-28 MED ORDER — FENTANYL CITRATE (PF) 100 MCG/2ML IJ SOLN
INTRAMUSCULAR | Status: AC
Start: 2017-04-28 — End: 2017-04-28
  Filled 2017-04-28: qty 2

## 2017-04-28 MED ORDER — ONDANSETRON HCL 4 MG PO TABS: 4.0000 mg | ORAL_TABLET | Freq: Four times a day (QID) | ORAL | Status: DC | PRN

## 2017-04-28 MED ORDER — ONDANSETRON HCL 4 MG/2ML IJ SOLN
4.0000 mg | Freq: Once | INTRAMUSCULAR | Status: AC
  Administered 2017-04-28: 4 mg via INTRAVENOUS

## 2017-04-28 MED ORDER — INSULIN ASPART 100 UNIT/ML ~~LOC~~ SOLN: 0.0000 [IU] | Freq: Every day | SUBCUTANEOUS | Status: DC

## 2017-04-28 MED ORDER — NAPROXEN SODIUM 550 MG PO TABS
550.0000 mg | ORAL_TABLET | Freq: Two times a day (BID) | ORAL | Status: DC
  Filled 2017-04-28: qty 1

## 2017-04-28 MED ORDER — ONDANSETRON HCL 4 MG/2ML IJ SOLN
INTRAMUSCULAR | Status: AC
  Filled 2017-04-28: qty 2

## 2017-04-28 MED ORDER — ONDANSETRON HCL 4 MG/2ML IJ SOLN
4.0000 mg | Freq: Four times a day (QID) | INTRAMUSCULAR | Status: DC | PRN
  Administered 2017-04-28: 4 mg via INTRAVENOUS
  Filled 2017-04-28: qty 2

## 2017-04-28 MED ORDER — DEXAMETHASONE SODIUM PHOSPHATE 10 MG/ML IJ SOLN
INTRAMUSCULAR | Status: DC | PRN
  Administered 2017-04-28: 5 mg via INTRAVENOUS

## 2017-04-28 MED ORDER — ACETAMINOPHEN 650 MG RE SUPP: 650.0000 mg | Freq: Four times a day (QID) | RECTAL | Status: DC | PRN

## 2017-04-28 MED ORDER — ASPIRIN 81 MG PO CHEW
81.0000 mg | CHEWABLE_TABLET | Freq: Every day | ORAL | Status: DC
  Filled 2017-04-28: qty 1

## 2017-04-28 MED ORDER — SUCCINYLCHOLINE CHLORIDE 20 MG/ML IJ SOLN
INTRAMUSCULAR | Status: DC | PRN
  Administered 2017-04-28: 100 mg via INTRAVENOUS

## 2017-04-28 MED ORDER — HYDROMORPHONE HCL 1 MG/ML IJ SOLN
INTRAMUSCULAR | Status: AC
  Filled 2017-04-28: qty 1

## 2017-04-28 MED ORDER — PROPOFOL 10 MG/ML IV BOLUS
INTRAVENOUS | Status: DC | PRN
  Administered 2017-04-28: 200 mg via INTRAVENOUS

## 2017-04-28 MED ORDER — OXYCODONE-ACETAMINOPHEN 5-325 MG PO TABS
1.0000 | ORAL_TABLET | Freq: Four times a day (QID) | ORAL | Status: DC | PRN
  Administered 2017-04-28: 2 via ORAL
  Filled 2017-04-28: qty 2

## 2017-04-28 MED ORDER — LIDOCAINE HCL (CARDIAC) 20 MG/ML IV SOLN
1.5000 mg/kg | INTRAVENOUS | Status: AC
  Administered 2017-04-28: 157.2 mg via INTRAVENOUS
  Filled 2017-04-28: qty 10

## 2017-04-28 MED ORDER — MOMETASONE FURO-FORMOTEROL FUM 200-5 MCG/ACT IN AERO
2.0000 | INHALATION_SPRAY | Freq: Two times a day (BID) | RESPIRATORY_TRACT | Status: DC
  Filled 2017-04-28: qty 8.8

## 2017-04-28 MED ORDER — HYDROMORPHONE HCL 1 MG/ML IJ SOLN: 1.0000 mg | INTRAMUSCULAR | Status: DC | PRN

## 2017-04-28 MED ORDER — OXYBUTYNIN CHLORIDE 5 MG PO TABS: 5.0000 mg | ORAL_TABLET | Freq: Three times a day (TID) | ORAL | Status: DC | PRN

## 2017-04-28 MED ORDER — INSULIN ASPART 100 UNIT/ML ~~LOC~~ SOLN: 0.0000 [IU] | Freq: Four times a day (QID) | SUBCUTANEOUS | Status: DC

## 2017-04-28 MED ORDER — FENTANYL CITRATE (PF) 100 MCG/2ML IJ SOLN: 25.0000 ug | INTRAMUSCULAR | Status: DC | PRN

## 2017-04-28 MED ORDER — FENTANYL CITRATE (PF) 100 MCG/2ML IJ SOLN
INTRAMUSCULAR | Status: DC | PRN
Start: 2017-04-28 — End: 2017-04-28
  Administered 2017-04-28 (×2): 50 ug via INTRAVENOUS

## 2017-04-28 MED ORDER — IOPAMIDOL (ISOVUE-300) INJECTION 61%
125.0000 mL | Freq: Once | INTRAVENOUS | Status: AC | PRN
  Administered 2017-04-28: 125 mL via INTRAVENOUS

## 2017-04-28 MED ORDER — ADULT MULTIVITAMIN W/MINERALS CH
1.0000 | ORAL_TABLET | Freq: Every day | ORAL | Status: DC
  Filled 2017-04-28: qty 1

## 2017-04-28 MED ORDER — DOCUSATE SODIUM 100 MG PO CAPS
100.0000 mg | ORAL_CAPSULE | Freq: Two times a day (BID) | ORAL | Status: DC
  Administered 2017-04-28 – 2017-04-29 (×2): 100 mg via ORAL
  Filled 2017-04-28 (×2): qty 1

## 2017-04-28 MED ORDER — IOPAMIDOL (ISOVUE-300) INJECTION 61%
30.0000 mL | Freq: Once | INTRAVENOUS | Status: AC
Start: 2017-04-28 — End: 2017-04-28
  Administered 2017-04-28: 30 mL via ORAL

## 2017-04-28 MED ORDER — PANTOPRAZOLE SODIUM 40 MG PO TBEC
40.0000 mg | DELAYED_RELEASE_TABLET | Freq: Every day | ORAL | Status: DC
Start: 2017-04-28 — End: 2017-04-29
  Filled 2017-04-28: qty 1

## 2017-04-28 MED ORDER — SODIUM CHLORIDE 0.9 % IV SOLN
INTRAVENOUS | Status: DC
  Administered 2017-04-28 (×4): via INTRAVENOUS
  Administered 2017-04-28: 150 mL/h via INTRAVENOUS
  Administered 2017-04-29: 06:00:00 via INTRAVENOUS

## 2017-04-28 MED ORDER — HYDROMORPHONE HCL 1 MG/ML IJ SOLN
1.0000 mg | Freq: Once | INTRAMUSCULAR | Status: AC
  Administered 2017-04-28: 1 mg via INTRAVENOUS

## 2017-04-28 MED ORDER — PROMETHAZINE HCL 25 MG/ML IJ SOLN: 6.2500 mg | INTRAMUSCULAR | Status: DC | PRN

## 2017-04-28 MED ORDER — LEVOFLOXACIN IN D5W 500 MG/100ML IV SOLN
500.0000 mg | Freq: Once | INTRAVENOUS | Status: AC
  Administered 2017-04-28: 500 mg via INTRAVENOUS
  Filled 2017-04-28: qty 100

## 2017-04-28 MED ORDER — HYDROMORPHONE HCL 1 MG/ML IJ SOLN
1.0000 mg | INTRAMUSCULAR | Status: AC
  Administered 2017-04-28: 1 mg via INTRAVENOUS
  Filled 2017-04-28: qty 1

## 2017-04-28 MED ORDER — ONDANSETRON HCL 4 MG/2ML IJ SOLN
INTRAMUSCULAR | Status: DC | PRN
  Administered 2017-04-28: 4 mg via INTRAVENOUS

## 2017-04-28 MED ORDER — ACETAMINOPHEN 325 MG PO TABS: 650.0000 mg | ORAL_TABLET | Freq: Four times a day (QID) | ORAL | Status: DC | PRN

## 2017-04-28 MED ORDER — TAMSULOSIN HCL 0.4 MG PO CAPS
0.4000 mg | ORAL_CAPSULE | Freq: Every day | ORAL | Status: DC
  Administered 2017-04-28 – 2017-04-29 (×2): 0.4 mg via ORAL
  Filled 2017-04-28 (×2): qty 1

## 2017-04-28 MED ORDER — LIDOCAINE HCL (CARDIAC) 20 MG/ML IV SOLN
INTRAVENOUS | Status: DC | PRN
  Administered 2017-04-28: 100 mg via INTRAVENOUS

## 2017-04-28 MED ORDER — LACTULOSE 10 GM/15ML PO SOLN: 20.0000 g | Freq: Every day | ORAL | Status: DC | PRN

## 2017-04-28 MED ORDER — SODIUM CHLORIDE 0.9 % IV BOLUS (SEPSIS)
1000.0000 mL | INTRAVENOUS | Status: AC
  Administered 2017-04-28: 1000 mL via INTRAVENOUS

## 2017-04-28 SURGICAL SUPPLY — 31 items
BAG DRAIN CYSTO-URO LG1000N (MISCELLANEOUS) ×2 IMPLANT
BASKET ZERO TIP 1.9FR (BASKET) IMPLANT
BRUSH SCRUB EZ 1% IODOPHOR (MISCELLANEOUS) ×2 IMPLANT
CATH ROBINSON RED A/P 16FR (CATHETERS) ×2 IMPLANT
CATH URETL 5X70 OPEN END (CATHETERS) ×2 IMPLANT
CNTNR SPEC 2.5X3XGRAD LEK (MISCELLANEOUS)
CONRAY 43 FOR UROLOGY 50M (MISCELLANEOUS) ×2 IMPLANT
CONT SPEC 4OZ STER OR WHT (MISCELLANEOUS)
CONTAINER SPEC 2.5X3XGRAD LEK (MISCELLANEOUS) IMPLANT
DRAPE UTILITY 15X26 TOWEL STRL (DRAPES) ×2 IMPLANT
FIBER LASER LITHO 273 (Laser) IMPLANT
GLOVE BIO SURGEON STRL SZ 6.5 (GLOVE) ×2 IMPLANT
GOWN STRL REUS W/ TWL LRG LVL3 (GOWN DISPOSABLE) ×2 IMPLANT
GOWN STRL REUS W/TWL LRG LVL3 (GOWN DISPOSABLE) ×2
GUIDEWIRE GREEN .038 145CM (MISCELLANEOUS) ×2 IMPLANT
GUIDEWIRE SUPER STIFF (WIRE) IMPLANT
INFUSOR MANOMETER BAG 3000ML (MISCELLANEOUS) ×2 IMPLANT
INTRODUCER DILATOR DOUBLE (INTRODUCER) IMPLANT
KIT RM TURNOVER CYSTO AR (KITS) ×2 IMPLANT
PACK CYSTO AR (MISCELLANEOUS) ×2 IMPLANT
SENSORWIRE 0.038 NOT ANGLED (WIRE) ×4
SET CYSTO W/LG BORE CLAMP LF (SET/KITS/TRAYS/PACK) ×2 IMPLANT
SET DILATOR URETRAL 8.5FR (MISCELLANEOUS) ×2 IMPLANT
SHEATH URETERAL 12FRX35CM (MISCELLANEOUS) IMPLANT
SOL .9 NS 3000ML IRR  AL (IV SOLUTION) ×1
SOL .9 NS 3000ML IRR UROMATIC (IV SOLUTION) ×1 IMPLANT
STENT URET 6FRX24 CONTOUR (STENTS) IMPLANT
STENT URET 6FRX26 CONTOUR (STENTS) ×2 IMPLANT
SURGILUBE 2OZ TUBE FLIPTOP (MISCELLANEOUS) ×2 IMPLANT
WATER STERILE IRR 1000ML POUR (IV SOLUTION) ×2 IMPLANT
WIRE SENSOR 0.038 NOT ANGLED (WIRE) ×2 IMPLANT

## 2017-04-28 NOTE — ED Provider Notes (Signed)
[facility] Emergency Department Provider Note  ____________________________________________   First MD Initiated Contact with Patient 04/28/17 0140     (approximate)  I have reviewed the triage vital signs and the nursing notes.   HISTORY  Chief Complaint Constipation    HPI Lance Decker is a 51 y.o. male who presents for evaluation of persistent and worsening abdominal pain accompanied with nausea and vomiting.  He was seen just under 12 hours ago in this ED for the same symptoms.  Refer to the prior ED provider note for more details, but in short, the patient was recently diagnosed with a 4-5 mm kidney stone and given narcotics.  Presumably as a result of the narcotics he has developed constipation.  He has been feeling severely bloated and with severe aching and cramping generalized abdominal pain in the setting of this constipation.  Additionally he has had multiple episodes of nausea and vomiting, usually after he attempts to eat.     Nothing in particular makes the patient's symptoms better.  When he was seen previously, they discussed getting a CT scan, but after the patient was successful in having a bowel movement after lactulose and an enema, Dr. yet decided against the CT scan and encouraged the patient to go home and continue on oral regimen and come back if his symptoms worsen.  The patient reports that shortly after going home his symptoms got much worse again.  He reports that the pain is unbearable and that he had some watery discharge from his bowels after getting home but has passed no more stool.  His had several episodes of nausea and vomiting accompanied with the cramping pain that causes him to be diaphoretic.  He denies recent fever/chills, chest pain, shortness of breath.  Past Medical History:  Diagnosis Date  . Diabetes mellitus without complication ([place])   . GERD (gastroesophageal reflux disease)   . H/O alcohol abuse    none since  1992  . MI (myocardial infarction) ([place]) 2002  . Sleep apnea     Patient Active Problem List   Diagnosis Date Noted  . BMI 40.0-44.9, adult ([place]) 01/25/2017  . Elevated liver enzymes 04/05/2016  . Pain in the chest 03/02/2015  . OSA treated with BiPAP 03/01/2015  . Diabetes mellitus, type 2 ([place]) 03/01/2015  . Diabetes mellitus without complication ([place]) 35/32/9924  . Hypogonadism male 08/05/2014    Past Surgical History:  Procedure Laterality Date  . ANAL FISSURECTOMY    . APPENDECTOMY    . CARDIAC CATHETERIZATION Left 03/02/2015   Procedure: Left Heart Cath and Coronary Angiography;  Surgeon: Yolonda Kida, MD;  Location: [facility];  Service: Cardiovascular;  Laterality: Left;  . PILONIDAL CYST / SINUS EXCISION    . PILONIDAL CYST EXCISION N/A 05/12/2015   Procedure: CYST EXCISION PILONIDAL EXTENSIVE;  Surgeon: Leonie Green, MD;  Location: [facility];  Service: General;  Laterality: N/A;    Prior to Admission medications   Medication Sig Start Date End Date Taking? Authorizing Provider  aspirin 81 MG tablet Take 81 mg by mouth daily.   Yes [provider]  lactulose (CHRONULAC) 10 GM/15ML solution Take 30 mLs (20 g total) by mouth daily as needed for mild constipation. 04/27/17  Yes Drenda Freeze, MD  metFORMIN (GLUCOPHAGE) 500 MG tablet Take 500 mg by mouth 2 (two) times daily with a meal.   Yes [provider]  Multiple Vitamin (MULTIVITAMIN) tablet Take 1 tablet by mouth  daily.   Yes [provider]  naproxen sodium (ANAPROX) 550 MG tablet Take 1 tablet (550 mg total) by mouth 2 (two) times daily with a meal. 04/24/17  Yes Carrie Mew, MD  omeprazole (PRILOSEC) 20 MG capsule Take 1 capsule (20 mg total) by mouth 2 (two) times daily before a meal. X 7 days Patient taking differently: Take 20 mg by mouth as needed. X 7 days 03/02/15  Yes Tama High III, MD  ondansetron (ZOFRAN ODT) 4 MG disintegrating tablet Take 1 tablet  (4 mg total) by mouth every 8 (eight) hours as needed for nausea or vomiting. 04/24/17  Yes Carrie Mew, MD  oxyCODONE-acetaminophen (ROXICET) 5-325 MG tablet Take 1-2 tablets by mouth every 6 (six) hours as needed for severe pain. 04/26/17  Yes Hollice Espy, MD  tamsulosin (FLOMAX) 0.4 MG CAPS capsule Take 1 capsule (0.4 mg total) by mouth daily. 04/24/17  Yes Carrie Mew, MD  Fluticasone-Salmeterol (ADVAIR DISKUS) 250-50 MCG/DOSE AEPB Inhale 1 puff into the lungs 2 (two) times daily. 12/17/15 12/16/16  Daymon Larsen, MD  HYDROcodone-acetaminophen (NORCO) 5-325 MG per tablet Take 1-2 tablets by mouth every 4 (four) hours as needed for moderate pain. Patient not taking: Reported on 04/28/2017 05/12/15   Leonie Green, MD    Allergies Penicillins  Family History  Problem Relation Age of Onset  . Heart disease Father   . Diabetes Mother   . Diabetes Maternal Grandfather   . Diabetes Maternal Grandmother   . Prostate cancer Neg Hx   . Kidney cancer Neg Hx     Social History Social History  Substance Use Topics  . Smoking status: Never Smoker  . Smokeless tobacco: Current User    Types: Snuff  . Alcohol use No    Review of Systems Constitutional: No fever/chills Eyes: No visual changes. ENT: No sore throat. Cardiovascular: Denies chest pain. Respiratory: Denies shortness of breath. Gastrointestinal: Severe generalized dull and cramping abdominal pain with bloating, nausea, vomiting, and constipation in the setting of recent narcotic use. Genitourinary: Negative for dysuria. Musculoskeletal: Negative for neck pain.  Left flank pain for about 4 days with diagnosis of kidney stone Integumentary: Negative for rash. Neurological: Negative for headaches, focal weakness or numbness.   ____________________________________________   PHYSICAL EXAM:  VITAL SIGNS: ED Triage Vitals  Enc Vitals Group     BP --      Pulse Rate 04/28/17 0049 83     Resp 04/28/17 0049 18      Temp 04/28/17 0049 98 F (36.7 C)     Temp Source 04/28/17 0049 Oral     SpO2 04/28/17 0049 98 %     Weight 04/28/17 0049 (!) 145.6 kg (321 lb)     Height --      Head Circumference --      Peak Flow --      Pain Score 04/28/17 0140 10     Pain Loc --      Pain Edu? --      Excl. in [place]? --     Constitutional: Alert and oriented. Patient is upset and in at least moderate distress from pain Eyes: Conjunctivae are normal.  Head: Atraumatic. Cardiovascular: Normal rate, regular rhythm. Good peripheral circulation. Grossly normal heart sounds. Respiratory: Normal respiratory effort.  No retractions. Lungs CTAB. Gastrointestinal: Morbid obesity.  Abdomen is firm and distended with generalized tenderness throughout.  No rebound, some voluntary guarding Musculoskeletal: No lower extremity tenderness nor edema. No gross deformities of  extremities. Neurologic:  Normal speech and language. No gross focal neurologic deficits are appreciated.  Skin:  Skin is warm, dry and intact. No rash noted. Psychiatric: Mood and affect are anxious and upset but generally appropriate  ____________________________________________   LABS (all labs ordered are listed, but only abnormal results are displayed)  Labs Reviewed  URINALYSIS, COMPLETE (UACMP) WITH MICROSCOPIC - Abnormal; Notable for the following:       Result Value   Color, Urine YELLOW (*)    APPearance CLEAR (*)    Specific Gravity, Urine 1.035 (*)    Hgb urine dipstick SMALL (*)    Ketones, ur 20 (*)    All other components within normal limits  URINE CULTURE   ____________________________________________  EKG  ED ECG REPORT I, Pau Banh, the attending physician, personally viewed and interpreted this ECG.  Date: 04/28/2017 EKG Time: 1:37 AM Rate: 79 Rhythm: normal sinus rhythm QRS Axis: normal Intervals: normal ST/T Wave abnormalities: Non-specific ST segment / T-wave changes, but no evidence of acute  ischemia. Narrative Interpretation: unremarkable  ____________________________________________  RADIOLOGY   Ct Abdomen Pelvis W Contrast  Result Date: 04/28/2017 CLINICAL DATA:  51 year old male with abdominal pain. EXAM: CT ABDOMEN AND PELVIS WITH CONTRAST TECHNIQUE: Multidetector CT imaging of the abdomen and pelvis was performed using the standard protocol following bolus administration of intravenous contrast. CONTRAST:  137mL ISOVUE-300 IOPAMIDOL (ISOVUE-300) INJECTION 61% COMPARISON:  Radiograph dated 04/27/2017 and CT dated 04/24/2017 FINDINGS: Lower chest: The visualized lung bases are clear. A 3 mm right lung base calcified granuloma. No intra-abdominal free air or free fluid. Hepatobiliary: There is diffuse fatty infiltration of the liver. No intrahepatic biliary ductal dilatation. The gallbladder is unremarkable. Pancreas: Unremarkable. No pancreatic ductal dilatation or surrounding inflammatory changes. Spleen: Normal in size without focal abnormality. Adrenals/Urinary Tract: The adrenal glands are unremarkable. There are two adjacent stones in the proximal left ureter measuring 4 mm in diameter and approximately 7 mm in length representing the same adjacent stones seen on the prior CT of 04/24/2017. There is mild left hydronephrosis, progressed compared to the prior CT. There is delayed enhancement and excretion of the left kidney. Small nonobstructing left renal inferior pole calculi again noted. The right kidney is unremarkable. The urinary bladder and the right ureter also appear unremarkable. Stomach/Bowel: There is no evidence of bowel obstruction or active inflammation. Appendectomy. Vascular/Lymphatic: The abdominal aorta and IVC appear unremarkable. The origins of the celiac axis, SMA, IMA and the renal arteries are patent. The SMV, splenic vein, and main portal vein are patent. No portal venous gas identified. Multiple small retroperitoneal and mesenteric lymph nodes, no adenopathy.  Reproductive: The prostate and seminal vesicles are grossly unremarkable. Other: Small fat containing umbilical hernia.  No fluid collection. Musculoskeletal: No acute or significant osseous findings. IMPRESSION: 1. Obstructing stones in the proximal left ureter close to the UPJ with interval worsening of the left hydronephrosis. Correlation with urinalysis recommended to exclude superimposed UTI. 2. No bowel obstruction or active inflammation. 3. Mild fatty liver. Electronically Signed   By: Anner Crete M.D.   On: 04/28/2017 02:48   Dg Abd Acute W/chest  Result Date: 04/27/2017 CLINICAL DATA:  Kidney stones 4 days ago along the left side. Constipation. EXAM: DG ABDOMEN ACUTE W/ 1V CHEST COMPARISON:  None. FINDINGS: There is no evidence of dilated bowel loops or free intraperitoneal air. 5 mm calcification projecting of the left psoas muscle at the level of L4 which may reflect a mid-distal ureteral calculus. Heart size  and mediastinal contours are within normal limits. Both lungs are clear. IMPRESSION: 5 mm calcification projecting of the left psoas muscle at the level of L4 which may reflect a mid-distal ureteral calculus. No acute cardiopulmonary disease. Electronically Signed   By: Kathreen Devoid   On: 04/27/2017 15:45    ____________________________________________   PROCEDURES  Critical Care performed: No   Procedure(s) performed:   Procedures   ____________________________________________   INITIAL IMPRESSION / ASSESSMENT AND PLAN / ED COURSE  Pertinent labs & imaging results that were available during my care of the patient were reviewed by me and considered in my medical decision making (see chart for details).  The patient is in severe distress from his abdominal pain which is likely secondary to constipation although SBO/ileus or other intra-abdominal pathology is possible.  Given his degree of distress I am giving him a dose of Dilaudid by explained to him that we need to  try and minimize narcotic use for the exact reason that he presents.  He understands but continues to express how painful his abdomen is and how concerned is that he has a physical obstruction.  I explained that we are proceeding with the CT scan with oral and IV contrast and will be able to come up with a plan after we get the results.  He had a rectal exam earlier today which was unremarkable in terms of a fecal impaction in the rectum and he reports the passage only of clear discharge from his rectum; there is no physical impaction that is retrievable with digital exam and there is no need to repeat the exam.   Clinical Course as of Apr 28 450  Fri Apr 28, 2017  0322 No evidence of bowel obstruction, ileus, or other emergent condition involving small bowel or colon.  The patient still has 2 stones in his proximal left ureter and they do not seem to progress significantly.  The radiologist is questioning, based on some surrounding edema and the progressing still mild hydronephrosis, whether the patient may have a UTI.  Urinalysis is pending at this time.  The patient is still in a great deal of distress and states the Dilaudid nothing for him but he is reluctant to proceed with narcotics given his constipation issues.  He said the CT scan I think it is likely that he may be suffering mostly from discomfort related to the kidney stones.  I have ordered IV lidocaine at 1.5 mg/kg but based on his adjusted weight given his morbid obesity to see if this will help alleviate some of his discomfort.  But I have also made it clear to him that we want to try to improve his pain and that more pain medicines available to him should he require it.  [CF]  [phone] Patient has received IV lidocaine for renal colic and states his pain is absolutely unchanged.  He continues to be anxious and reported his pain is 10 out of 10.  He is very frustrated and he and his wife continued to point out that he has not been able to eat or  drink anything and spent most of his time at home on the floor "in agony".  This is his third emergency department visit for renal colic.  We discussed various options but at this point I believe I must admit him for intractable pain secondary to his 2 left ureteral stones with mild hydronephrosis.  There is no evidence of infection on his urinalysis and I will defer  any empiric antibiotic treatment to the admitting team or the urology consultant.  I am providing a bolus of fluids and another round of Dilaudid even though we both discussed the risks and benefits of additional narcotics; given that his pain is uncontrolled, he cannot get comfortable, and cannot rest, and has no evidence of significant fecal impaction or obstruction, I think the benefit of pain management at this point outweighs the risk.  [CF]    Clinical Course User Index [CF] Hinda Kehr, MD    ____________________________________________  FINAL CLINICAL IMPRESSION(S) / ED DIAGNOSES  Final diagnoses:  Intractable pain  Renal colic on left side  Generalized abdominal pain  Nausea and vomiting, intractability of vomiting not specified, unspecified vomiting type  Constipation, unspecified constipation type  Ureteral stone with hydronephrosis     MEDICATIONS GIVEN DURING THIS VISIT:  Medications  HYDROmorphone (DILAUDID) injection 1 mg (1 mg Intravenous Given 04/28/17 0142)  ondansetron (ZOFRAN) injection 4 mg (4 mg Intravenous Given 04/28/17 0141)  iopamidol (ISOVUE-300) 61 % injection 30 mL (30 mLs Oral Contrast Given 04/28/17 0149)  iopamidol (ISOVUE-300) 61 % injection 125 mL (125 mLs Intravenous Contrast Given 04/28/17 0230)  lidocaine (cardiac) 100 mg/58ml (XYLOCAINE) 20 MG/ML injection 2% 157.2 mg (157.2 mg Intravenous Given 04/28/17 0321)  HYDROmorphone (DILAUDID) injection 1 mg (1 mg Intravenous Given 04/28/17 0427)  sodium chloride 0.9 % bolus 1,000 mL (1,000 mLs Intravenous New Bag/Given 04/28/17 0427)     NEW  OUTPATIENT MEDICATIONS STARTED DURING THIS VISIT:  New Prescriptions   No medications on file    Modified Medications   No medications on file    Discontinued Medications   No medications on file     Note:  This document was prepared using Dragon voice recognition software and may include unintentional dictation errors.    Hinda Kehr, MD 04/28/17 [phone]

## 2017-04-28 NOTE — Op Note (Signed)
Date of procedure: 04/28/17  Preoperative diagnosis:  1. Left proximal obstructing ureteral calculus 2. Refractory left flank pain   Postoperative diagnosis:  1. Same as above   Procedure: 1. Left ureteroscopy 2. Left retrograde pyelogram 3. Left ureteral stent placement  Surgeon: Hollice Espy, MD  Anesthesia: General  Complications: None  Intraoperative findings: Narrowing at the mid ureter just distal to the stone, unable to advance semirigid or flexible ureteroscope to the level of the stone. Stent placed with plans for staged procedure.  EBL: Minimal  Specimens: None  Drains: 6 x 26 French double-J ureteral stent on left  Indication: Lance Decker is a 51 y.o. patient with an obstructing 7 mm left proximal/mid ureteral calculus is been admitted with refractory pain.  After reviewing the management options for treatment, he elected to proceed with the above surgical procedure(s). We have discussed the potential benefits and risks of the procedure, side effects of the proposed treatment, the likelihood of the patient achieving the goals of the procedure, and any potential problems that might occur during the procedure or recuperation. Informed consent has been obtained.  Description of procedure:  The patient was taken to the operating room and general anesthesia was induced.  The patient was placed in the dorsal lithotomy position, prepped and draped in the usual sterile fashion, and preoperative antibiotics were administered. A preoperative time-out was performed.   Male souunds were used to 56 [place] were used to gently dilate the urethral meatus. A 21 French scope was advanced per urethra into the bladder. Attention was turned to the left ureteral orifice which was cannulated using a 5 [place] open-ended ureteral catheter. The stone could be seen within the proximal pole/mid ureter and a similar location as the CT scan. A sensor wire was then placed up to level of the  kidney without difficulty. The cystoscopy place as a safety wire. A semirigid ureteroscope was then brought in and advanced to the distal ureter, but I was unable to advance the scope beyond the iliacs due to anatomic constraints. A second Super Stiff wire was then advanced under direct visualization. I then attempted to advance a 7 French flexible ureteroscope up to level of the stone. The scope was able to advance to the level of the mid ureter but 1-2 cm distal to the stone. Narrowing of the ureter was appreciated at this level. I tried several maneuvers in order to get the scope to level of the stone including dilation within 8 French catheter. As such, the decision was made to return for staged procedure. The safety wire was backloaded over a rigid cystoscope. A 6 x 26 French double-J ureteral stent was advanced over the wire up to level of the kidney. There was partially drawn until full coil was noted within the renal pelvis. The wire was then fully withdrawn and a full coil stab of the bladder. Bladder was then drained. It was then cleaned and dried, repositioned the supine position, reversed from anesthesia, taken the PACU in stable condition.  Plan: I had a lengthy discussion today with the patient's wife and family. They understand the difficulty accessing the stone. Options including proceeding with shockwave lithotripsy next week versus ureteroscopy were discussed. Risk and benefits of each were discussed. I do believe that he have a higher stone free rate with planned staged ureteroscopy as well as able to treat his noncutting left lower pole stone. After discussion, we've agreed rebook him for late next week as an outpatient for staged procedure.  All questions are answered.  He'll return back to the floor and likely be discharged either seeping versus tomorrow. Please discharge the patient with pain medication, Flomax, as well as oxybutynin 5 mg 3 times a day when necessary for bladder  spasms.  Hollice Espy, M.D.

## 2017-04-28 NOTE — Transfer of Care (Signed)
Immediate Anesthesia Transfer of Care Note  Patient: Lance Decker  Procedure(s) Performed: Procedure(s): CYSTOSCOPY/URETEROSCOPY/HOLMIUM LASER/STENT PLACEMENT (Left)  Patient Location: PACU  Anesthesia Type:General  Level of Consciousness: awake  Airway & Oxygen Therapy: Patient Spontanous Breathing  Post-op Assessment: Report given to RN  Post vital signs: stable  Last Vitals:  Vitals:   04/28/17 1141 04/28/17 1337  BP: 127/76 113/75  Pulse: 78 84  Resp: 16 16  Temp: (!) 36.2 C (!) 36.1 C    Last Pain:  Vitals:   04/28/17 1337  TempSrc:   PainSc: 0-No pain      Patients Stated Pain Goal: 0 (88/50/27 7412)  Complications: No apparent anesthesia complications

## 2017-04-28 NOTE — Anesthesia Procedure Notes (Signed)
Procedure Name: Intubation Date/Time: 04/28/2017 12:33 PM Performed by: Zetta Bills Pre-anesthesia Checklist: Patient identified, Emergency Drugs available, Suction available and Patient being monitored Patient Re-evaluated:Patient Re-evaluated prior to induction Oxygen Delivery Method: Circle system utilized Preoxygenation: Pre-oxygenation with 100% oxygen Induction Type: IV induction Laryngoscope Size: McGraph and 4 Grade View: Grade I Tube type: Oral Tube size: 7.0 mm Number of attempts: 1 Airway Equipment and Method: Patient positioned with wedge pillow and Video-laryngoscopy Placement Confirmation: ETT inserted through vocal cords under direct vision,  positive ETCO2 and breath sounds checked- equal and bilateral Secured at: 22 cm Tube secured with: Tape Dental Injury: Teeth and Oropharynx as per pre-operative assessment  Comments: Rapid sequence- mask ventilation not attempted. Very easy McGrath view/placement with chest and head elevated on multiple pillows

## 2017-04-28 NOTE — Anesthesia Post-op Follow-up Note (Cosign Needed)
Anesthesia QCDR form completed.        

## 2017-04-28 NOTE — Anesthesia Preprocedure Evaluation (Signed)
Anesthesia Evaluation  Patient identified by MRN, date of birth, ID band Patient awake    Reviewed: Allergy & Precautions, H&P , NPO status , Patient's Chart, lab work & pertinent test results, reviewed documented beta blocker date and time   History of Anesthesia Complications Negative for: history of anesthetic complications  Airway Mallampati: IV  TM Distance: >3 FB Neck ROM: full    Dental  (+) Dental Advidsory Given, Caps, Teeth Intact, Missing   Pulmonary neg shortness of breath, sleep apnea , neg COPD, neg recent URI,           Cardiovascular Exercise Tolerance: Good (-) hypertension(-) angina+ CAD and + Past MI  (-) Cardiac Stents and (-) CABG (-) dysrhythmias (-) Valvular Problems/Murmurs     Neuro/Psych negative neurological ROS  negative psych ROS   GI/Hepatic Neg liver ROS, GERD  ,  Endo/Other  diabetesMorbid obesity  Renal/GU negative Renal ROS  negative genitourinary   Musculoskeletal   Abdominal   Peds  Hematology negative hematology ROS (+)   Anesthesia Other Findings Past Medical History: No date: CAD (coronary artery disease) No date: Diabetes mellitus without complication ([facility]) No date: GERD (gastroesophageal reflux disease) No date: H/O alcohol abuse     Comment:  none since 1992 2002: MI (myocardial infarction) ([facility]) No date: OSA (obstructive sleep apnea)   Reproductive/Obstetrics negative OB ROS                             Anesthesia Physical  Anesthesia Plan  ASA: III  Anesthesia Plan: General   Post-op Pain Management:    Induction: Intravenous  PONV Risk Score and Plan: 2 and Ondansetron and Dexamethasone  Airway Management Planned: Oral ETT  Additional Equipment:   Intra-op Plan:   Post-operative Plan: Extubation in OR  Informed Consent: I have reviewed the patients History and Physical, chart, labs and discussed the procedure including the  risks, benefits and alternatives for the proposed anesthesia with the patient or authorized representative who has indicated his/her understanding and acceptance.   Dental Advisory Given  Plan Discussed with: Anesthesiologist, CRNA and Surgeon  Anesthesia Plan Comments:         Anesthesia Quick Evaluation  

## 2017-04-28 NOTE — Consult Note (Signed)
Urology Consult  I have been asked to see the patient by Dr. Marcille Blanco, for evaluation and management of left obstructing ureteral calculus, refractory pain.  Chief Complaint: Left flank pain, nausea vomiting  History of Present Illness: Lance Decker is a 51 y.o. year old with a known obstructing left proximal ureteral stone who scheduled have shockwave lithotripsy next week. He will return to the emergency room now twice since her visit 2 days ago with constipation, ongoing pain, nausea and vomiting. He was admitted overnight for pain control.  He did have a repeat CT scan which with shows only slight interval progression of the stone. He does have slightly worse hydronephrosis.  He is otherwise afebrile, no significant leukocytosis, negative urine. No fevers or chills.  No dysuria.  This morning, he continues to ride in bed with discomfort. He complains of being nauseous.  He also complains of constipation and bloating.    Past Medical History:  Diagnosis Date  . CAD (coronary artery disease)   . Diabetes mellitus without complication ([place])   . GERD (gastroesophageal reflux disease)   . H/O alcohol abuse    none since 1992  . MI (myocardial infarction) ([place]) 2002  . OSA (obstructive sleep apnea)     Past Surgical History:  Procedure Laterality Date  . ANAL FISSURECTOMY    . APPENDECTOMY    . CARDIAC CATHETERIZATION Left 03/02/2015   Procedure: Left Heart Cath and Coronary Angiography;  Surgeon: Yolonda Kida, MD;  Location: [facility];  Service: Cardiovascular;  Laterality: Left;  . PILONIDAL CYST / SINUS EXCISION    . PILONIDAL CYST EXCISION N/A 05/12/2015   Procedure: CYST EXCISION PILONIDAL EXTENSIVE;  Surgeon: Leonie Green, MD;  Location: [facility];  Service: General;  Laterality: N/A;    Home Medications:  Current Meds  Medication Sig  . aspirin 81 MG tablet Take 81 mg by mouth daily.  Marland Kitchen lactulose (CHRONULAC) 10 GM/15ML solution Take 30 mLs  (20 g total) by mouth daily as needed for mild constipation.  . metFORMIN (GLUCOPHAGE) 500 MG tablet Take 500 mg by mouth 2 (two) times daily with a meal.  . Multiple Vitamin (MULTIVITAMIN) tablet Take 1 tablet by mouth daily.  . naproxen sodium (ANAPROX) 550 MG tablet Take 1 tablet (550 mg total) by mouth 2 (two) times daily with a meal.  . omeprazole (PRILOSEC) 20 MG capsule Take 1 capsule (20 mg total) by mouth 2 (two) times daily before a meal. X 7 days (Patient taking differently: Take 20 mg by mouth as needed. X 7 days)  . ondansetron (ZOFRAN ODT) 4 MG disintegrating tablet Take 1 tablet (4 mg total) by mouth every 8 (eight) hours as needed for nausea or vomiting.  Marland Kitchen oxyCODONE-acetaminophen (ROXICET) 5-325 MG tablet Take 1-2 tablets by mouth every 6 (six) hours as needed for severe pain.  . tamsulosin (FLOMAX) 0.4 MG CAPS capsule Take 1 capsule (0.4 mg total) by mouth daily.    Allergies:  Allergies  Allergen Reactions  . Penicillins Anaphylaxis    Family History  Problem Relation Age of Onset  . Heart disease Father   . Diabetes Mother   . Diabetes Maternal Grandfather   . Diabetes Maternal Grandmother   . Prostate cancer Neg Hx   . Kidney cancer Neg Hx     Social History:  reports that he has never smoked. His smokeless tobacco use includes Snuff. He reports that he does not drink alcohol or use drugs.  ROS: A complete review of systems was performed.  All systems are negative except for pertinent findings as noted.  Physical Exam:  Vital signs in last 24 hours: Temp:  [98 F (36.7 C)-98.7 F (37.1 C)] 98.4 F (36.9 C) (07/13 0617) Pulse Rate:  [74-86] 82 (07/13 0617) Resp:  [14-21] 18 (07/13 0617) BP: (120-142)/(67-87) 124/73 (07/13 0617) SpO2:  [94 %-99 %] 98 % (07/13 0617) Weight:  [321 lb (145.6 kg)-331 lb 12.8 oz (150.5 kg)] 331 lb 12.8 oz (150.5 kg) (07/13 0617) Constitutional:  Alert and oriented, No acute distress.  Wearing CPAP. HEENT: [place] AT, moist mucus  membranes.  Trachea midline, no masses Cardiovascular: Regular rate and rhythm, no clubbing, cyanosis, or edema. Respiratory: Normal respiratory effort, lungs clear bilaterally GI: Abdomen is soft, nontender, nondistended, no abdominal masses.  Obese.   GU: No CVA tenderness Skin: No rashes, bruises or suspicious lesions Neurologic: Grossly intact, no focal deficits, moving all 4 extremities Psychiatric: Normal mood and affect   Laboratory Data:   Recent Labs  04/27/17 1521  WBC 11.3*  HGB 13.8  HCT 39.8*    Recent Labs  04/27/17 1521  NA 137  K 4.6  CL 101  CO2 26  GLUCOSE 108*  BUN 16  CREATININE 1.11  CALCIUM 8.9   No results for input(s): LABPT, INR in the last 72 hours. No results for input(s): LABURIN in the last 72 hours. Results for orders placed or performed in visit on 04/26/17  Microscopic Examination     Status: Abnormal   Collection Time: 04/26/17  8:08 AM  Result Value Ref Range Status   WBC, UA 0-5 0 - 5 /hpf Final   RBC, UA 3-10 (A) 0 - 2 /hpf Final   Epithelial Cells (non renal) 0-10 0 - 10 /hpf Final   Bacteria, UA None seen None seen/Few Final     Radiologic Imaging: Abdomen 1 View (kub)  Result Date: 04/26/2017 CLINICAL DATA:  Acute left-sided abdominal pain. EXAM: ABDOMEN - 1 VIEW COMPARISON:  CT scan of April 24, 2017. FINDINGS: The bowel gas pattern is normal. Focal calcification is seen to the left of the L3-4 disc space which corresponds to ureteral calculus noted on prior CT scan. No other calculi are noted. IMPRESSION: Probable left ureteral calculus is noted which corresponds to abnormality seen on recent CT scan. No evidence of bowel obstruction or ileus. Electronically Signed   By: Marijo Conception, M.D.   On: 04/26/2017 10:43   Ct Abdomen Pelvis W Contrast  Result Date: 04/28/2017 CLINICAL DATA:  51 year old male with abdominal pain. EXAM: CT ABDOMEN AND PELVIS WITH CONTRAST TECHNIQUE: Multidetector CT imaging of the abdomen and pelvis  was performed using the standard protocol following bolus administration of intravenous contrast. CONTRAST:  120mL ISOVUE-300 IOPAMIDOL (ISOVUE-300) INJECTION 61% COMPARISON:  Radiograph dated 04/27/2017 and CT dated 04/24/2017 FINDINGS: Lower chest: The visualized lung bases are clear. A 3 mm right lung base calcified granuloma. No intra-abdominal free air or free fluid. Hepatobiliary: There is diffuse fatty infiltration of the liver. No intrahepatic biliary ductal dilatation. The gallbladder is unremarkable. Pancreas: Unremarkable. No pancreatic ductal dilatation or surrounding inflammatory changes. Spleen: Normal in size without focal abnormality. Adrenals/Urinary Tract: The adrenal glands are unremarkable. There are two adjacent stones in the proximal left ureter measuring 4 mm in diameter and approximately 7 mm in length representing the same adjacent stones seen on the prior CT of 04/24/2017. There is mild left hydronephrosis, progressed compared to the prior CT.  There is delayed enhancement and excretion of the left kidney. Small nonobstructing left renal inferior pole calculi again noted. The right kidney is unremarkable. The urinary bladder and the right ureter also appear unremarkable. Stomach/Bowel: There is no evidence of bowel obstruction or active inflammation. Appendectomy. Vascular/Lymphatic: The abdominal aorta and IVC appear unremarkable. The origins of the celiac axis, SMA, IMA and the renal arteries are patent. The SMV, splenic vein, and main portal vein are patent. No portal venous gas identified. Multiple small retroperitoneal and mesenteric lymph nodes, no adenopathy. Reproductive: The prostate and seminal vesicles are grossly unremarkable. Other: Small fat containing umbilical hernia.  No fluid collection. Musculoskeletal: No acute or significant osseous findings. IMPRESSION: 1. Obstructing stones in the proximal left ureter close to the UPJ with interval worsening of the left hydronephrosis.  Correlation with urinalysis recommended to exclude superimposed UTI. 2. No bowel obstruction or active inflammation. 3. Mild fatty liver. Electronically Signed   By: Anner Crete M.D.   On: 04/28/2017 02:48   Dg Abd Acute W/chest  Result Date: 04/27/2017 CLINICAL DATA:  Kidney stones 4 days ago along the left side. Constipation. EXAM: DG ABDOMEN ACUTE W/ 1V CHEST COMPARISON:  None. FINDINGS: There is no evidence of dilated bowel loops or free intraperitoneal air. 5 mm calcification projecting of the left psoas muscle at the level of L4 which may reflect a mid-distal ureteral calculus. Heart size and mediastinal contours are within normal limits. Both lungs are clear. IMPRESSION: 5 mm calcification projecting of the left psoas muscle at the level of L4 which may reflect a mid-distal ureteral calculus. No acute cardiopulmonary disease. Electronically Signed   By: Kathreen Devoid   On: 04/27/2017 15:45   CT scan personally reviewed.    Impression/Plan: 51 year old male with an obstructing 7 mm proximal/mid ureteral calculus without evidence of infection admitted with refractory flank pain, nausea and vomiting.  With a lengthy discussion today about options for management of the stone. Options including continued medical expulsive therapy, intervention via ureteroscopy, laser lithotripsy with stent, stent only, or pain management with ESWL next week as planned. Risks and benefits of each were discussed in detail. After lengthy discussion, he is mostly associated in pursuing ureteroscopy today for definitive management of the stone.  Risks and benefits of ureteroscopy were reviewed including but not limited to infection, bleeding, pain, ureteral injury which could require open surgery versus prolonged indwelling if ureteral perforation occurs, persistent stone disease, requirement for staged procedure, possible stent, and global anesthesia risks. Patient expressed understanding and desires to proceed with  ureteroscopy.  We'll plan for add-on procedure today. Patient may be discharged later on today for his pain is better controlled and his nausea vomiting improves.  04/28/2017, 8:56 AM  Hollice Espy,  MD

## 2017-04-28 NOTE — ED Triage Notes (Signed)
Pt to triage in wheelchair. Pt was seen in ED last night for constipation, pt seen Dr. Darl Householder, was given enema and mag citrate with relief. Pt was sent home with diagnosis of upper right colon blockage. Pt told to return if pain increased or unable to move stool due to the need of CT to rule out obstruction. Pt to ED due to increased pain and inability to pass stool.

## 2017-04-28 NOTE — Progress Notes (Signed)
[facility] [place] NAME: Lance Decker    MR#:  [id number]  DATE OF BIRTH:  1965/11/14  SUBJECTIVE:  CHIEF COMPLAINT:   Chief Complaint  Patient presents with  . Constipation   Ur3eteral stone, recurrent visits to ER.  Urology tried procedure, could not remove the stone. C/o pain and nausea.  REVIEW OF SYSTEMS:  CONSTITUTIONAL: No fever, fatigue or weakness.  EYES: No blurred or double vision.  EARS, NOSE, AND THROAT: No tinnitus or ear pain.  RESPIRATORY: No cough, shortness of breath, wheezing or hemoptysis.  CARDIOVASCULAR: No chest pain, orthopnea, edema.  GASTROINTESTINAL: No nausea, vomiting, diarrhea , have abdominal pain. Have constipation and bloating. GENITOURINARY: No dysuria, hematuria.  ENDOCRINE: No polyuria, nocturia,  HEMATOLOGY: No anemia, easy bruising or bleeding SKIN: No rash or lesion. MUSCULOSKELETAL: No joint pain or arthritis.   NEUROLOGIC: No tingling, numbness, weakness.  PSYCHIATRY: No anxiety or depression.   ROS  DRUG ALLERGIES:   Allergies  Allergen Reactions  . Penicillins Anaphylaxis    VITALS:  Blood pressure 122/75, pulse 75, temperature 97.9 F (36.6 C), resp. rate 16, height 6' (1.829 m), weight (!) 150.1 kg (331 lb), SpO2 97 %.  PHYSICAL EXAMINATION:  GENERAL:  51 y.o.-year-old patient lying in the bed with no acute distress.  EYES: Pupils equal, round, reactive to light and accommodation. No scleral icterus. Extraocular muscles intact.  HEENT: Head atraumatic, normocephalic. Oropharynx and nasopharynx clear.  NECK:  Supple, no jugular venous distention. No thyroid enlargement, no tenderness.  LUNGS: Normal breath sounds bilaterally, no wheezing, rales,rhonchi or crepitation. No use of accessory muscles of respiration.  CARDIOVASCULAR: S1, S2 normal. No murmurs, rubs, or gallops.  ABDOMEN: Soft, tender, nondistended. Bowel sounds present. No organomegaly or mass.  EXTREMITIES: No pedal  edema, cyanosis, or clubbing.  NEUROLOGIC: Cranial nerves II through XII are intact. Muscle strength 5/5 in all extremities. Sensation intact. Gait not checked.  PSYCHIATRIC: The patient is alert and oriented x 3.  SKIN: No obvious rash, lesion, or ulcer.   Physical Exam LABORATORY PANEL:   CBC  Recent Labs Lab 04/27/17 1521  WBC 11.3*  HGB 13.8  HCT 39.8*  PLT 202   ------------------------------------------------------------------------------------------------------------------  Chemistries   Recent Labs Lab 04/27/17 1521  NA 137  K 4.6  CL 101  CO2 26  GLUCOSE 108*  BUN 16  CREATININE 1.11  CALCIUM 8.9  AST 19  ALT 25  ALKPHOS 53  BILITOT 1.6*   ------------------------------------------------------------------------------------------------------------------  Cardiac Enzymes No results for input(s): TROPONINI in the last 168 hours. ------------------------------------------------------------------------------------------------------------------  RADIOLOGY:  Ct Abdomen Pelvis W Contrast  Result Date: 04/28/2017 CLINICAL DATA:  51 year old male with abdominal pain. EXAM: CT ABDOMEN AND PELVIS WITH CONTRAST TECHNIQUE: Multidetector CT imaging of the abdomen and pelvis was performed using the standard protocol following bolus administration of intravenous contrast. CONTRAST:  181mL ISOVUE-300 IOPAMIDOL (ISOVUE-300) INJECTION 61% COMPARISON:  Radiograph dated 04/27/2017 and CT dated 04/24/2017 FINDINGS: Lower chest: The visualized lung bases are clear. A 3 mm right lung base calcified granuloma. No intra-abdominal free air or free fluid. Hepatobiliary: There is diffuse fatty infiltration of the liver. No intrahepatic biliary ductal dilatation. The gallbladder is unremarkable. Pancreas: Unremarkable. No pancreatic ductal dilatation or surrounding inflammatory changes. Spleen: Normal in size without focal abnormality. Adrenals/Urinary Tract: The adrenal glands are  unremarkable. There are two adjacent stones in the proximal left ureter measuring 4 mm in diameter and approximately 7 mm in length representing the same  adjacent stones seen on the prior CT of 04/24/2017. There is mild left hydronephrosis, progressed compared to the prior CT. There is delayed enhancement and excretion of the left kidney. Small nonobstructing left renal inferior pole calculi again noted. The right kidney is unremarkable. The urinary bladder and the right ureter also appear unremarkable. Stomach/Bowel: There is no evidence of bowel obstruction or active inflammation. Appendectomy. Vascular/Lymphatic: The abdominal aorta and IVC appear unremarkable. The origins of the celiac axis, SMA, IMA and the renal arteries are patent. The SMV, splenic vein, and main portal vein are patent. No portal venous gas identified. Multiple small retroperitoneal and mesenteric lymph nodes, no adenopathy. Reproductive: The prostate and seminal vesicles are grossly unremarkable. Other: Small fat containing umbilical hernia.  No fluid collection. Musculoskeletal: No acute or significant osseous findings. IMPRESSION: 1. Obstructing stones in the proximal left ureter close to the UPJ with interval worsening of the left hydronephrosis. Correlation with urinalysis recommended to exclude superimposed UTI. 2. No bowel obstruction or active inflammation. 3. Mild fatty liver. Electronically Signed   By: Anner Crete M.D.   On: 04/28/2017 02:48   Dg Abd Acute W/chest  Result Date: 04/27/2017 CLINICAL DATA:  Kidney stones 4 days ago along the left side. Constipation. EXAM: DG ABDOMEN ACUTE W/ 1V CHEST COMPARISON:  None. FINDINGS: There is no evidence of dilated bowel loops or free intraperitoneal air. 5 mm calcification projecting of the left psoas muscle at the level of L4 which may reflect a mid-distal ureteral calculus. Heart size and mediastinal contours are within normal limits. Both lungs are clear. IMPRESSION: 5 mm  calcification projecting of the left psoas muscle at the level of L4 which may reflect a mid-distal ureteral calculus. No acute cardiopulmonary disease. Electronically Signed   By: Kathreen Devoid   On: 04/27/2017 15:45    ASSESSMENT AND PLAN:   Active Problems:   Intractable nausea and vomiting   Intractable pain   Renal colic on left side  This is a 51 year old male admitted for nausea and vomiting. 1. Nausea and vomiting: Intractable- renal stone ; secondary to renal colic. Supportive care. The patient does not have indication of infection at this time. Consulted urology.  tried removing stone- failed.  medical management for now.   Flomax,  Pain meds, Oxybutenin  IV nausea meds. 2. CAD: Stable; aspirin for now. 3. Diabetes mellitus type 2: Hold oral hypoglycemic agents. Sliding insulin hospitalized. 4. Obstructive sleep apnea CPAP per home settings 5. COPD: Continue inhaled corticosteroid as well as albuterol 6. BPH: Continue tamsulosin 7. DVT prophylaxis: SCDs 8. GI prophylaxis: Has resolved per home regimen   All the records are reviewed and case discussed with Care Management/Social Workerr. Management plans discussed with the patient, family and they are in agreement.  CODE STATUS: Full.  TOTAL TIME TAKING CARE OF THIS PATIENT: 35 minutes.    POSSIBLE D/C IN1-2 DAYS, DEPENDING ON CLINICAL CONDITION.   Vaughan Basta M.D on 04/28/2017   Between 7am to 6pm - Pager - [phone]  After 6pm go to [email or web address] - password [email or web address] [facility]  Office  [phone]  CC: Primary care physician; Adin Hector, MD  Note: This dictation was prepared with Dragon dictation along with smaller phrase technology. Any transcriptional errors that result from this process are unintentional.

## 2017-04-28 NOTE — H&P (Signed)
Lance Decker is an 51 y.o. male.   Chief Complaint: Constipation HPI: The patient with past medical history of diabetes as well as coronary artery disease status post myocardial infarction presents emergency department complaining of constipation. This is the patient's second ED visit in 24 hours and third visit this week. The first visit he was diagnosed with kidney stones largest which is 4.5 mm. Since that time he has had recurrent episodes of nausea and vomiting. The patient denies fevers. He states that his pain medication is not quite adequate and has also constipated him. The patient received an enema in the emergency department as well as another CT scan of the abdomen. The latter did not show heavy stool burden. After multiple doses of pain medication as well as antiemetics the patient continued to have nausea and vomiting which prompted emergency department staff to call the hospitalist service for admission.  Past Medical History:  Diagnosis Date  . CAD (coronary artery disease)   . Diabetes mellitus without complication ([place])   . GERD (gastroesophageal reflux disease)   . H/O alcohol abuse    none since 1992  . MI (myocardial infarction) ([place]) 2002  . OSA (obstructive sleep apnea)     Past Surgical History:  Procedure Laterality Date  . ANAL FISSURECTOMY    . APPENDECTOMY    . CARDIAC CATHETERIZATION Left 03/02/2015   Procedure: Left Heart Cath and Coronary Angiography;  Surgeon: Yolonda Kida, MD;  Location: [facility];  Service: Cardiovascular;  Laterality: Left;  . PILONIDAL CYST / SINUS EXCISION    . PILONIDAL CYST EXCISION N/A 05/12/2015   Procedure: CYST EXCISION PILONIDAL EXTENSIVE;  Surgeon: Leonie Green, MD;  Location: [facility];  Service: General;  Laterality: N/A;    Family History  Problem Relation Age of Onset  . Heart disease Father   . Diabetes Mother   . Diabetes Maternal Grandfather   . Diabetes Maternal Grandmother   . Prostate  cancer Neg Hx   . Kidney cancer Neg Hx    Social History:  reports that he has never smoked. His smokeless tobacco use includes Snuff. He reports that he does not drink alcohol or use drugs.  Allergies:  Allergies  Allergen Reactions  . Penicillins Anaphylaxis    Prior to Admission medications   Medication Sig Start Date End Date Taking? Authorizing Provider  aspirin 81 MG tablet Take 81 mg by mouth daily.   Yes [provider]  lactulose (CHRONULAC) 10 GM/15ML solution Take 30 mLs (20 g total) by mouth daily as needed for mild constipation. 04/27/17  Yes Drenda Freeze, MD  metFORMIN (GLUCOPHAGE) 500 MG tablet Take 500 mg by mouth 2 (two) times daily with a meal.   Yes [provider]  Multiple Vitamin (MULTIVITAMIN) tablet Take 1 tablet by mouth daily.   Yes [provider]  naproxen sodium (ANAPROX) 550 MG tablet Take 1 tablet (550 mg total) by mouth 2 (two) times daily with a meal. 04/24/17  Yes Carrie Mew, MD  omeprazole (PRILOSEC) 20 MG capsule Take 1 capsule (20 mg total) by mouth 2 (two) times daily before a meal. X 7 days Patient taking differently: Take 20 mg by mouth as needed. X 7 days 03/02/15  Yes Tama High III, MD  ondansetron (ZOFRAN ODT) 4 MG disintegrating tablet Take 1 tablet (4 mg total) by mouth every 8 (eight) hours as needed for nausea or vomiting. 04/24/17  Yes Carrie Mew, MD  oxyCODONE-acetaminophen (ROXICET)  5-325 MG tablet Take 1-2 tablets by mouth every 6 (six) hours as needed for severe pain. 04/26/17  Yes Hollice Espy, MD  tamsulosin (FLOMAX) 0.4 MG CAPS capsule Take 1 capsule (0.4 mg total) by mouth daily. 04/24/17  Yes Carrie Mew, MD  Fluticasone-Salmeterol (ADVAIR DISKUS) 250-50 MCG/DOSE AEPB Inhale 1 puff into the lungs 2 (two) times daily. 12/17/15 12/16/16  Daymon Larsen, MD  HYDROcodone-acetaminophen (NORCO) 5-325 MG per tablet Take 1-2 tablets by mouth every 4 (four) hours as needed for moderate  pain. Patient not taking: Reported on 04/28/2017 05/12/15   Leonie Green, MD     Results for orders placed or performed during the hospital encounter of 04/28/17 (from the past 48 hour(s))  Urinalysis, Complete w Microscopic     Status: Abnormal   Collection Time: 04/28/17  3:20 AM  Result Value Ref Range   Color, Urine YELLOW (A) YELLOW   APPearance CLEAR (A) CLEAR   Specific Gravity, Urine 1.035 (H) 1.005 - 1.030   pH 6.0 5.0 - 8.0   Glucose, UA NEGATIVE NEGATIVE mg/dL   Hgb urine dipstick SMALL (A) NEGATIVE   Bilirubin Urine NEGATIVE NEGATIVE   Ketones, ur 20 (A) NEGATIVE mg/dL   Protein, ur NEGATIVE NEGATIVE mg/dL   Nitrite NEGATIVE NEGATIVE   Leukocytes, UA NEGATIVE NEGATIVE   RBC / HPF 0-5 0 - 5 RBC/hpf   WBC, UA 0-5 0 - 5 WBC/hpf   Bacteria, UA NONE SEEN NONE SEEN   Squamous Epithelial / LPF NONE SEEN NONE SEEN   Mucous PRESENT    Abdomen 1 View (kub)  Result Date: 04/26/2017 CLINICAL DATA:  Acute left-sided abdominal pain. EXAM: ABDOMEN - 1 VIEW COMPARISON:  CT scan of April 24, 2017. FINDINGS: The bowel gas pattern is normal. Focal calcification is seen to the left of the L3-4 disc space which corresponds to ureteral calculus noted on prior CT scan. No other calculi are noted. IMPRESSION: Probable left ureteral calculus is noted which corresponds to abnormality seen on recent CT scan. No evidence of bowel obstruction or ileus. Electronically Signed   By: Marijo Conception, M.D.   On: 04/26/2017 10:43   Ct Abdomen Pelvis W Contrast  Result Date: 04/28/2017 CLINICAL DATA:  51 year old male with abdominal pain. EXAM: CT ABDOMEN AND PELVIS WITH CONTRAST TECHNIQUE: Multidetector CT imaging of the abdomen and pelvis was performed using the standard protocol following bolus administration of intravenous contrast. CONTRAST:  138mL ISOVUE-300 IOPAMIDOL (ISOVUE-300) INJECTION 61% COMPARISON:  Radiograph dated 04/27/2017 and CT dated 04/24/2017 FINDINGS: Lower chest: The visualized  lung bases are clear. A 3 mm right lung base calcified granuloma. No intra-abdominal free air or free fluid. Hepatobiliary: There is diffuse fatty infiltration of the liver. No intrahepatic biliary ductal dilatation. The gallbladder is unremarkable. Pancreas: Unremarkable. No pancreatic ductal dilatation or surrounding inflammatory changes. Spleen: Normal in size without focal abnormality. Adrenals/Urinary Tract: The adrenal glands are unremarkable. There are two adjacent stones in the proximal left ureter measuring 4 mm in diameter and approximately 7 mm in length representing the same adjacent stones seen on the prior CT of 04/24/2017. There is mild left hydronephrosis, progressed compared to the prior CT. There is delayed enhancement and excretion of the left kidney. Small nonobstructing left renal inferior pole calculi again noted. The right kidney is unremarkable. The urinary bladder and the right ureter also appear unremarkable. Stomach/Bowel: There is no evidence of bowel obstruction or active inflammation. Appendectomy. Vascular/Lymphatic: The abdominal aorta and IVC appear unremarkable. The  origins of the celiac axis, SMA, IMA and the renal arteries are patent. The SMV, splenic vein, and main portal vein are patent. No portal venous gas identified. Multiple small retroperitoneal and mesenteric lymph nodes, no adenopathy. Reproductive: The prostate and seminal vesicles are grossly unremarkable. Other: Small fat containing umbilical hernia.  No fluid collection. Musculoskeletal: No acute or significant osseous findings. IMPRESSION: 1. Obstructing stones in the proximal left ureter close to the UPJ with interval worsening of the left hydronephrosis. Correlation with urinalysis recommended to exclude superimposed UTI. 2. No bowel obstruction or active inflammation. 3. Mild fatty liver. Electronically Signed   By: Anner Crete M.D.   On: 04/28/2017 02:48   Dg Abd Acute W/chest  Result Date:  04/27/2017 CLINICAL DATA:  Kidney stones 4 days ago along the left side. Constipation. EXAM: DG ABDOMEN ACUTE W/ 1V CHEST COMPARISON:  None. FINDINGS: There is no evidence of dilated bowel loops or free intraperitoneal air. 5 mm calcification projecting of the left psoas muscle at the level of L4 which may reflect a mid-distal ureteral calculus. Heart size and mediastinal contours are within normal limits. Both lungs are clear. IMPRESSION: 5 mm calcification projecting of the left psoas muscle at the level of L4 which may reflect a mid-distal ureteral calculus. No acute cardiopulmonary disease. Electronically Signed   By: Kathreen Devoid   On: 04/27/2017 15:45    Review of Systems  Constitutional: Negative for chills and fever.  HENT: Negative for sore throat and tinnitus.   Eyes: Negative for blurred vision and redness.  Respiratory: Negative for cough and shortness of breath.   Cardiovascular: Negative for chest pain, palpitations, orthopnea and PND.  Gastrointestinal: Positive for abdominal pain, constipation, nausea and vomiting. Negative for diarrhea.  Genitourinary: Negative for dysuria, frequency and urgency.  Musculoskeletal: Negative for joint pain and myalgias.  Skin: Negative for rash.       No lesions  Neurological: Negative for speech change, focal weakness and weakness.  Endo/Heme/Allergies: Does not bruise/bleed easily.       No temperature intolerance  Psychiatric/Behavioral: Negative for depression and suicidal ideas.    Blood pressure 139/86, pulse 80, temperature 98 F (36.7 C), temperature source Oral, resp. rate 16, weight (!) 145.6 kg (321 lb), SpO2 94 %. Physical Exam  Constitutional: He is oriented to person, place, and time. He appears well-developed and well-nourished. No distress.  HENT:  Head: Normocephalic and atraumatic.  Mouth/Throat: Oropharynx is clear and moist.  Eyes: Pupils are equal, round, and reactive to light. Conjunctivae and EOM are normal. No scleral  icterus.  Neck: Normal range of motion. Neck supple. No JVD present. No tracheal deviation present. No thyromegaly present.  Cardiovascular: Normal rate, regular rhythm and normal heart sounds.   Respiratory: Effort normal and breath sounds normal. No respiratory distress.  GI: Soft. Bowel sounds are normal. He exhibits no distension. There is no tenderness.  Genitourinary:  Genitourinary Comments: Deferred  Musculoskeletal: Normal range of motion. He exhibits no edema.  Lymphadenopathy:    He has no cervical adenopathy.  Neurological: He is alert and oriented to person, place, and time. No cranial nerve deficit.  Skin: Skin is warm and dry. No rash noted. No erythema.  Psychiatric: He has a normal mood and affect. His behavior is normal. Judgment and thought content normal.     Assessment/Plan This is a 51 year old male admitted for nausea and vomiting. 1. Nausea and vomiting: Intractable; secondary to renal colic. Supportive care. The patient does not have indication  of infection at this time. Consult urology. 2. CAD: Stable; hold aspirin for now. 3. Diabetes mellitus type 2: Hold oral hypoglycemic agents. Sliding insulin hospitalized. 4. Obstructive sleep apnea CPAP per home settings 5. COPD: Continue inhaled corticosteroid as well as albuterol 6. BPH: Continue tamsulosin 7. DVT prophylaxis: SCDs 8. GI prophylaxis: Has resolved per home regimen The patient is a full code. Time spent on admission orders and patient care possibly 45 minutes  Harrie Foreman, MD 04/28/2017, 5:45 AM

## 2017-04-28 NOTE — Progress Notes (Signed)
Patient returned earlier from [employer]

## 2017-04-28 NOTE — Progress Notes (Signed)
Patient left for the OR

## 2017-04-28 NOTE — Progress Notes (Signed)
Dr Erlene Quan notified per the patient request so she could talk to the patient.  Order received for carb modified diet

## 2017-04-29 LAB — URINE CULTURE
CULTURE: NO GROWTH
SPECIAL REQUESTS: NORMAL

## 2017-04-29 LAB — GLUCOSE, CAPILLARY
Glucose-Capillary: 113 mg/dL — ABNORMAL HIGH (ref 65–99)
Glucose-Capillary: 129 mg/dL — ABNORMAL HIGH (ref 65–99)

## 2017-04-29 LAB — HEMOGLOBIN A1C
Hgb A1c MFr Bld: 5.4 % (ref 4.8–5.6)
Mean Plasma Glucose: 108 mg/dL

## 2017-04-29 MED ORDER — OXYBUTYNIN CHLORIDE 5 MG PO TABS: 5.0000 mg | ORAL_TABLET | Freq: Three times a day (TID) | ORAL | 0 refills | Status: DC | PRN

## 2017-04-29 MED ORDER — TAMSULOSIN HCL 0.4 MG PO CAPS: 0.4000 mg | ORAL_CAPSULE | Freq: Every day | ORAL | 0 refills | Status: DC

## 2017-04-29 MED ORDER — OXYCODONE HCL 15 MG PO TABS: 7.5000 mg | ORAL_TABLET | Freq: Four times a day (QID) | ORAL | 0 refills | Status: DC | PRN

## 2017-04-29 NOTE — Progress Notes (Signed)
Pt d/c home; d/c instructions reviewed w/ pt; pt understanding was verbalized; IV removed, catheter in tact, gauze dressing applied; all pt questions answered; pt verbalized that all pt belongings were accounted for; work note printed and given to pt;  pt left unit via wheelchair accompanied by staff

## 2017-04-29 NOTE — Discharge Summary (Addendum)
Lance Decker at [place] NAME: Lance Decker    MR#:  [id number]  DATE OF BIRTH:  [birth year]  DATE OF ADMISSION:  04/28/2017   ADMITTING PHYSICIAN: Harrie Foreman, MD  DATE OF DISCHARGE: 04/29/2017 PRIMARY CARE PHYSICIAN: Adin Hector, MD   ADMISSION DIAGNOSIS:  Generalized abdominal pain [S96.28] Renal colic on left side [Z66] Intractable pain [R52] Ureteral stone with hydronephrosis [N13.2] Constipation, unspecified constipation type [K59.00] Nausea and vomiting, intractability of vomiting not specified, unspecified vomiting type [R11.2] DISCHARGE DIAGNOSIS:  Active Problems:   Intractable nausea and vomiting   Intractable pain   Renal colic on left side Left proximal obstructing ureteral calculus SECONDARY DIAGNOSIS:   Past Medical History:  Diagnosis Date  . CAD (coronary artery disease)   . Diabetes mellitus without complication ([place])   . GERD (gastroesophageal reflux disease)   . H/O alcohol abuse    none since 1992  . MI (myocardial infarction) ([place]) 2002  . OSA (obstructive sleep apnea)    HOSPITAL COURSE:   This is a 51 year old male admitted for nausea and vomiting. 1. Nausea and vomiting: Intractable- renal stone ; secondary to renal colic. Left proximal obstructing ureteral calculus.  Supportive care. The patient does not have indication of infection at this time. Consulted urology.  tried removing stone- failed.  medical management for now.   Flomax,  Pain meds, Oxybutenin prn.  IV nausea meds. 2. CAD: Stable; aspirin for now. 3. Diabetes mellitus type 2: Hold oral hypoglycemic agents. Sliding insulin hospitalized. 4. Obstructive sleep apnea CPAP per home settings 5. COPD: Continue inhaled corticosteroid as well as albuterol 6. BPH: Continue tamsulosin  DISCHARGE CONDITIONS:  Stable, discharge to home today. CONSULTS OBTAINED:  Treatment Team:  Hollice Espy, MD DRUG ALLERGIES:   Allergies    Allergen Reactions  . Penicillins Anaphylaxis   DISCHARGE MEDICATIONS:   Allergies as of 04/29/2017      Reactions   Penicillins Anaphylaxis      Medication List    STOP taking these medications   HYDROcodone-acetaminophen 5-325 MG tablet Commonly known as:  NORCO   naproxen sodium 550 MG tablet Commonly known as:  ANAPROX   oxyCODONE-acetaminophen 5-325 MG tablet Commonly known as:  ROXICET     TAKE these medications   aspirin 81 MG tablet Take 81 mg by mouth daily.   Fluticasone-Salmeterol 250-50 MCG/DOSE Aepb Commonly known as:  ADVAIR DISKUS Inhale 1 puff into the lungs 2 (two) times daily.   lactulose 10 GM/15ML solution Commonly known as:  CHRONULAC Take 30 mLs (20 g total) by mouth daily as needed for mild constipation.   metFORMIN 500 MG tablet Commonly known as:  GLUCOPHAGE Take 500 mg by mouth 2 (two) times daily with a meal.   multivitamin tablet Take 1 tablet by mouth daily.   omeprazole 20 MG capsule Commonly known as:  PRILOSEC Take 1 capsule (20 mg total) by mouth 2 (two) times daily before a meal. X 7 days What changed:  when to take this  reasons to take this  additional instructions   ondansetron 4 MG disintegrating tablet Commonly known as:  ZOFRAN ODT Take 1 tablet (4 mg total) by mouth every 8 (eight) hours as needed for nausea or vomiting.   oxybutynin 5 MG tablet Commonly known as:  DITROPAN Take 1 tablet (5 mg total) by mouth every 8 (eight) hours as needed for bladder spasms.   oxyCODONE 15 MG immediate release tablet Commonly known  as:  ROXICODONE Take 0.5 tablets (7.5 mg total) by mouth every 6 (six) hours as needed for moderate pain, severe pain or breakthrough pain.   tamsulosin 0.4 MG Caps capsule Commonly known as:  FLOMAX Take 1 capsule (0.4 mg total) by mouth daily.        DISCHARGE INSTRUCTIONS:  See AVS.  If you experience worsening of your admission symptoms, develop shortness of breath, life threatening  emergency, suicidal or homicidal thoughts you must seek medical attention immediately by calling 911 or calling your MD immediately  if symptoms less severe.  You Must read complete instructions/literature along with all the possible adverse reactions/side effects for all the Medicines you take and that have been prescribed to you. Take any new Medicines after you have completely understood and accpet all the possible adverse reactions/side effects.   Please note  You were cared for by a hospitalist during your hospital stay. If you have any questions about your discharge medications or the care you received while you were in the hospital after you are discharged, you can call the unit and asked to speak with the hospitalist on call if the hospitalist that took care of you is not available. Once you are discharged, your primary care physician will handle any further medical issues. Please note that NO REFILLS for any discharge medications will be authorized once you are discharged, as it is imperative that you return to your primary care physician (or establish a relationship with a primary care physician if you do not have one) for your aftercare needs so that they can reassess your need for medications and monitor your lab values.    On the day of Discharge:  VITAL SIGNS:  Blood pressure (!) 101/57, pulse 64, temperature 98.1 F (36.7 C), temperature source Oral, resp. rate 20, height 6' (1.829 m), weight (!) 331 lb 11.2 oz (150.5 kg), SpO2 97 %. PHYSICAL EXAMINATION:  GENERAL:  51 y.o.-year-old patient lying in the bed with no acute distress. Morbidly obese. EYES: Pupils equal, round, reactive to light and accommodation. No scleral icterus. Extraocular muscles intact.  HEENT: Head atraumatic, normocephalic. Oropharynx and nasopharynx clear.  NECK:  Supple, no jugular venous distention. No thyroid enlargement, no tenderness.  LUNGS: Normal breath sounds bilaterally, no wheezing, rales,rhonchi or  crepitation. No use of accessory muscles of respiration.  CARDIOVASCULAR: S1, S2 normal. No murmurs, rubs, or gallops.  ABDOMEN: Soft, non-tender, non-distended. Bowel sounds present. No organomegaly or mass.  EXTREMITIES: No pedal edema, cyanosis, or clubbing.  NEUROLOGIC: Cranial nerves II through XII are intact. Muscle strength 5/5 in all extremities. Sensation intact. Gait not checked.  PSYCHIATRIC: The patient is alert and oriented x 3.  SKIN: No obvious rash, lesion, or ulcer.  DATA REVIEW:   CBC  Recent Labs Lab 04/27/17 1521  WBC 11.3*  HGB 13.8  HCT 39.8*  PLT 202    Chemistries   Recent Labs Lab 04/27/17 1521  NA 137  K 4.6  CL 101  CO2 26  GLUCOSE 108*  BUN 16  CREATININE 1.11  CALCIUM 8.9  AST 19  ALT 25  ALKPHOS 54  BILITOT 1.6*     Microbiology Results  Results for orders placed or performed during the hospital encounter of 04/28/17  Urine Culture     Status: None   Collection Time: 04/28/17  3:20 AM  Result Value Ref Range Status   Specimen Description URINE, CLEAN CATCH  Final   Special Requests Normal  Final   Culture  Final    NO GROWTH Performed at [facility], [place] [address] [place], [place] [address]    Report Status 04/29/2017 FINAL  Final    RADIOLOGY:  No results found.   Management plans discussed with the patient, his wife and they are in agreement.  CODE STATUS: Full Code   TOTAL TIME TAKING CARE OF THIS PATIENT: 33 minutes.    Demetrios Loll M.D on 04/29/2017 at 11:40 AM  Between 7am to 6pm - Pager - [phone]  After 6pm go to [email or web address] - password [email or web address] [facility]  [facility]  Office  [phone]  CC: Primary care physician; Adin Hector, MD   Note: This dictation was prepared with Dragon dictation along with smaller phrase technology. Any transcriptional errors that result from this process are unintentional.

## 2017-04-29 NOTE — Discharge Instructions (Signed)
Heart healthy and ADA diet. °

## 2017-04-29 NOTE — Progress Notes (Signed)
Lance Decker at [place] was admitted to the Hospital on 04/28/2017 and Discharged  04/29/2017 and should be excused from work/school   for 8  days starting 04/28/2017 , may return to work/school without any restrictions.  Demetrios Loll M.D on 04/29/2017,at 11:34 AM  [place] at [facility]  [phone]

## 2017-04-30 ENCOUNTER — Encounter: Payer: Self-pay | Admitting: Urology

## 2017-05-01 ENCOUNTER — Other Ambulatory Visit: Payer: Self-pay | Admitting: Radiology

## 2017-05-01 ENCOUNTER — Other Ambulatory Visit: Payer: Managed Care, Other (non HMO)

## 2017-05-01 ENCOUNTER — Telehealth: Payer: Self-pay | Admitting: Radiology

## 2017-05-01 DIAGNOSIS — N201 Calculus of ureter: Secondary | ICD-10-CM

## 2017-05-01 LAB — URINALYSIS, COMPLETE
BILIRUBIN UA: NEGATIVE
Glucose, UA: NEGATIVE
KETONES UA: NEGATIVE
NITRITE UA: NEGATIVE
PH UA: 7 (ref 5.0–7.5)
SPEC GRAV UA: 1.015 (ref 1.005–1.030)

## 2017-05-01 LAB — MICROSCOPIC EXAMINATION: RBC, UA: >30 /hpf — AB (ref 0–?)

## 2017-05-01 NOTE — Telephone Encounter (Signed)
Ureteroscopy scheduled with Dr Pilar Jarvis on 05/05/17. Wife would like pt to meet Dr Pilar Jarvis prior to surgery so appt made for 05/04/17 in clinic. Wife aware of appt with Dr Pilar Jarvis, surgery, pre-admit testing appt & to come to office for ua & ucx prior to surgery. Questions answered. Wife voices understanding.

## 2017-05-02 ENCOUNTER — Encounter
Admission: RE | Admit: 2017-05-02 | Discharge: 2017-05-02 | Disposition: A | Discharge status: Home / Self Care | Payer: Managed Care, Other (non HMO) | Source: Ambulatory Visit | Attending: Urology | Admitting: Urology

## 2017-05-02 DIAGNOSIS — Z880 Allergy status to penicillin: Secondary | ICD-10-CM | POA: Diagnosis not present

## 2017-05-02 DIAGNOSIS — G4733 Obstructive sleep apnea (adult) (pediatric): Secondary | ICD-10-CM | POA: Diagnosis not present

## 2017-05-02 DIAGNOSIS — Z7984 Long term (current) use of oral hypoglycemic drugs: Secondary | ICD-10-CM | POA: Diagnosis not present

## 2017-05-02 DIAGNOSIS — N202 Calculus of kidney with calculus of ureter: Secondary | ICD-10-CM | POA: Diagnosis present

## 2017-05-02 DIAGNOSIS — Z79899 Other long term (current) drug therapy: Secondary | ICD-10-CM | POA: Diagnosis not present

## 2017-05-02 DIAGNOSIS — Z720 Tobacco use: Secondary | ICD-10-CM | POA: Diagnosis not present

## 2017-05-02 DIAGNOSIS — K219 Gastro-esophageal reflux disease without esophagitis: Secondary | ICD-10-CM | POA: Diagnosis not present

## 2017-05-02 DIAGNOSIS — Z7982 Long term (current) use of aspirin: Secondary | ICD-10-CM | POA: Diagnosis not present

## 2017-05-02 DIAGNOSIS — N359 Urethral stricture, unspecified: Secondary | ICD-10-CM | POA: Diagnosis not present

## 2017-05-02 DIAGNOSIS — E119 Type 2 diabetes mellitus without complications: Secondary | ICD-10-CM | POA: Diagnosis not present

## 2017-05-02 DIAGNOSIS — I2510 Atherosclerotic heart disease of native coronary artery without angina pectoris: Secondary | ICD-10-CM | POA: Diagnosis not present

## 2017-05-02 DIAGNOSIS — I252 Old myocardial infarction: Secondary | ICD-10-CM | POA: Diagnosis not present

## 2017-05-02 NOTE — Patient Instructions (Signed)
Your procedure is scheduled on: Friday 05/05/17 Report to [facility]. 2ND FLOOR MEDICAL MALL ENTRANCE. To find out your arrival time please call [phone] between 1PM - 3PM on Thursday 05/04/17.  Remember: Instructions that are not followed completely may result in serious medical risk, up to and including death, or upon the discretion of your surgeon and anesthesiologist your surgery may need to be rescheduled.    __X__ 1. Do not eat food or drink liquids after midnight. No gum chewing or hard candies.     __X__ 2. No Alcohol for 24 hours before or after surgery.   ____ 3. Bring all medications with you on the day of surgery if instructed.    __X__ 4. Notify your doctor if there is any change in your medical condition     (cold, fever, infections).             __x___5. No smoking within 24 hours of your surgery.     Do not wear jewelry, make-up, hairpins, clips or nail polish.  Do not wear lotions, powders, or perfumes.   Do not shave 48 hours prior to surgery. Men may shave face and neck.  Do not bring valuables to the hospital.    [facility] is not responsible for any belongings or valuables.               Contacts, dentures or bridgework may not be worn into surgery.  Leave your suitcase in the car. After surgery it may be brought to your room.  For patients admitted to the hospital, discharge time is determined by your                treatment team.   Patients discharged the day of surgery will not be allowed to drive home.   Please read over the following fact sheets that you were given:   MRSA Information   __x__ Take these medicines the morning of surgery with A SIP OF WATER:    1. prilosec  2. tamsulosin  3. oxybutinin if needed  4. May have oxycodone for pain if needed  5.  6.  ____ Fleet Enema (as directed)   ____ Use CHG Soap as directed  ____ Use inhalers on the day of surgery  ____ Stop metformin 2 days prior to surgery    ____ Take 1/2 of usual  insulin dose the night before surgery and none on the morning of surgery.   __x__ Stop Coumadin/Plavix/aspirin on (already stopped)  __X__ Stop Anti-inflammatories such as Advil, Aleve, Ibuprofen, Motrin, Naproxen, Naprosyn, Goodies,powder, or aspirin products.  OK to take Tylenol.   ____ Stop supplements until after surgery.    __x__ Bring C-Pap to the hospital.

## 2017-05-02 NOTE — Anesthesia Postprocedure Evaluation (Signed)
Anesthesia Post Note  Patient: Lance Decker  Procedure(s) Performed: Procedure(s) (LRB): CYSTOSCOPY/URETEROSCOPY/HOLMIUM LASER/STENT PLACEMENT (Left)  Patient location during evaluation: PACU Anesthesia Type: General Level of consciousness: awake and alert Pain management: pain level controlled Vital Signs Assessment: post-procedure vital signs reviewed and stable Respiratory status: spontaneous breathing, nonlabored ventilation, respiratory function stable and patient connected to nasal cannula oxygen Cardiovascular status: blood pressure returned to baseline and stable Postop Assessment: no signs of nausea or vomiting Anesthetic complications: no     Last Vitals:  Vitals:   04/28/17 2000 04/29/17 0424  BP: (!) 105/47 (!) 101/57  Pulse: 67 64  Resp: 18 20  Temp: 36.8 C 36.7 C    Last Pain:  Vitals:   04/29/17 1109  TempSrc:   PainSc: 3                  Martha Clan

## 2017-05-04 ENCOUNTER — Ambulatory Visit (INDEPENDENT_AMBULATORY_CARE_PROVIDER_SITE_OTHER): Payer: Managed Care, Other (non HMO) | Admitting: Urology

## 2017-05-04 ENCOUNTER — Encounter: Payer: Self-pay | Admitting: Urology

## 2017-05-04 VITALS — BP 117/75 | HR 76 | Ht 72.0 in | Wt 330.0 lb

## 2017-05-04 DIAGNOSIS — N200 Calculus of kidney: Secondary | ICD-10-CM

## 2017-05-04 LAB — CULTURE, URINE COMPREHENSIVE

## 2017-05-04 MED ORDER — CIPROFLOXACIN IN D5W 400 MG/200ML IV SOLN
400.0000 mg | INTRAVENOUS | Status: AC
  Administered 2017-05-05: 400 mg via INTRAVENOUS

## 2017-05-04 NOTE — Progress Notes (Signed)
05/04/2017 8:25 AM   Lance Decker Apr 08, 1966 [id number]  Referring provider: Adin Hector, MD [place] [facility] [place], [place] [address]  Chief Complaint  Patient presents with  . Nephrolithiasis    discuss surgery    HPI: The patient is a 51 year old male with 4x7 mm proximal left ureteral Status post left ureteral stent placement after his ureter was too narrow to safely perform ureteroscopy last week. He presents today to discuss definitive surgical management which is tentatively scheduled for tomorrow. He also has a small left lower pole stone as well which was also not addressed during his last procedure.    PMH: Past Medical History:  Diagnosis Date  . CAD (coronary artery disease)   . Diabetes mellitus without complication ([place])   . GERD (gastroesophageal reflux disease)   . H/O alcohol abuse    none since 1992  . MI (myocardial infarction) ([place]) 2002  . OSA (obstructive sleep apnea)     Surgical History: Past Surgical History:  Procedure Laterality Date  . ANAL FISSURECTOMY    . APPENDECTOMY    . CARDIAC CATHETERIZATION Left 03/02/2015   Procedure: Left Heart Cath and Coronary Angiography;  Surgeon: Yolonda Kida, MD;  Location: [place] [facility];  Service: Cardiovascular;  Laterality: Left;  . CYSTOSCOPY/URETEROSCOPY/HOLMIUM LASER/STENT PLACEMENT Left 04/28/2017   Procedure: CYSTOSCOPY/URETEROSCOPY/HOLMIUM LASER/STENT PLACEMENT;  Surgeon: Hollice Espy, MD;  Location: [facility];  Service: Urology;  Laterality: Left;  . PILONIDAL CYST / SINUS EXCISION    . PILONIDAL CYST EXCISION N/A 05/12/2015   Procedure: CYST EXCISION PILONIDAL EXTENSIVE;  Surgeon: Leonie Green, MD;  Location: [facility] OR[facility];  Service: General;  Laterality: N/A;    Home Medications:  Allergies as of 05/04/2017      Reactions   Penicillins Anaphylaxis   Has patient had a PCN reaction causing immediate rash, facial/tongue/throat swelling, SOB or  lightheadedness with hypotension: Yes Has patient had a PCN reaction causing severe rash involving mucus membranes or skin necrosis: No Has patient had a PCN reaction that required hospitalization: Yes Has patient had a PCN reaction occurring within the last 10 years: No If all of the above answers are "NO", then may proceed with Cephalosporin use.      Medication List       Accurate as of 05/04/17  8:25 AM. Always use your most recent med list.          aspirin 81 MG tablet Take 81 mg by mouth daily.   docusate sodium 100 MG capsule Commonly known as:  COLACE Take 100 mg by mouth daily.   lactulose 10 GM/15ML solution Commonly known as:  CHRONULAC Take 30 mLs (20 g total) by mouth daily as needed for mild constipation.   metFORMIN 500 MG tablet Commonly known as:  GLUCOPHAGE Take 500 mg by mouth 2 (two) times daily.   multivitamin tablet Take 1 tablet by mouth daily.   omeprazole 20 MG capsule Commonly known as:  PRILOSEC Take 1 capsule (20 mg total) by mouth 2 (two) times daily before a meal. X 7 days   ondansetron 4 MG disintegrating tablet Commonly known as:  ZOFRAN ODT Take 1 tablet (4 mg total) by mouth every 8 (eight) hours as needed for nausea or vomiting.   oxybutynin 5 MG tablet Commonly known as:  DITROPAN Take 1 tablet (5 mg total) by mouth every 8 (eight) hours as needed for bladder spasms.   oxyCODONE 15 MG immediate  release tablet Commonly known as:  ROXICODONE Take 0.5 tablets (7.5 mg total) by mouth every 6 (six) hours as needed for moderate pain, severe pain or breakthrough pain.   tamsulosin 0.4 MG Caps capsule Commonly known as:  FLOMAX Take 1 capsule (0.4 mg total) by mouth daily.       Allergies:  Allergies  Allergen Reactions  . Penicillins Anaphylaxis    Has patient had a PCN reaction causing immediate rash, facial/tongue/throat swelling, SOB or lightheadedness with hypotension: Yes Has patient had a PCN reaction causing severe rash  involving mucus membranes or skin necrosis: No Has patient had a PCN reaction that required hospitalization: Yes Has patient had a PCN reaction occurring within the last 10 years: No If all of the above answers are "NO", then may proceed with Cephalosporin use.     Family History: Family History  Problem Relation Age of Onset  . Heart disease Father   . Diabetes Mother   . Diabetes Maternal Grandfather   . Diabetes Maternal Grandmother   . Prostate cancer Neg Hx   . Kidney cancer Neg Hx     Social History:  reports that he has never smoked. His smokeless tobacco use includes Snuff. He reports that he does not drink alcohol or use drugs.  ROS: UROLOGY Frequent Urination?: Yes Hard to postpone urination?: No Burning/pain with urination?: No Get up at night to urinate?: Yes Leakage of urine?: No Urine stream starts and stops?: No Trouble starting stream?: No Do you have to strain to urinate?: No Blood in urine?: No Urinary tract infection?: No Sexually transmitted disease?: No Injury to kidneys or bladder?: No Painful intercourse?: No Weak stream?: No Erection problems?: No Penile pain?: No  Gastrointestinal Nausea?: No Vomiting?: No Indigestion/heartburn?: No Diarrhea?: No Constipation?: Yes  Constitutional Fever: No Night sweats?: No Weight loss?: No Fatigue?: No  Skin Skin rash/lesions?: No Itching?: No  Eyes Blurred vision?: No Double vision?: No  Ears/Nose/Throat Sore throat?: No Sinus problems?: No  Hematologic/Lymphatic Swollen glands?: No Easy bruising?: No  Cardiovascular Leg swelling?: No Chest pain?: No  Respiratory Cough?: No Shortness of breath?: No  Endocrine Excessive thirst?: No  Musculoskeletal Back pain?: No Joint pain?: No  Neurological Headaches?: No Dizziness?: No  Psychologic Depression?: No Anxiety?: No  Physical Exam: BP 117/75   Pulse 76   Ht 6' (1.829 m)   Wt (!) 330 lb (149.7 kg)   BMI 44.76 kg/m    Constitutional:  Alert and oriented, No acute distress. HEENT: [place] AT, moist mucus membranes.  Trachea midline, no masses. Cardiovascular: No clubbing, cyanosis, or edema. Respiratory: Normal respiratory effort, no increased work of breathing. GI: Abdomen is soft, nontender, nondistended, no abdominal masses GU: No CVA tenderness.  Skin: No rashes, bruises or suspicious lesions. Lymph: No cervical or inguinal adenopathy. Neurologic: Grossly intact, no focal deficits, moving all 4 extremities. Psychiatric: Normal mood and affect.  Laboratory Data: Lab Results  Component Value Date   WBC 11.3 (H) 04/27/2017   HGB 13.8 04/27/2017   HCT 39.8 (L) 04/27/2017   MCV 83.3 04/27/2017   PLT 202 04/27/2017    Lab Results  Component Value Date   CREATININE 1.11 04/27/2017    No results found for: PSA  No results found for: TESTOSTERONE  Lab Results  Component Value Date   HGBA1C 5.4 04/27/2017    Urinalysis    Component Value Date/Time   COLORURINE YELLOW (A) 04/28/2017 0320   APPEARANCEUR Cloudy (A) 05/01/2017 1442   LABSPEC  1.035 (H) 04/28/2017 0320   PHURINE 6.0 04/28/2017 0320   GLUCOSEU Negative 05/01/2017 1442   HGBUR SMALL (A) 04/28/2017 0320   BILIRUBINUR Negative 05/01/2017 1442   KETONESUR 20 (A) 04/28/2017 0320   PROTEINUR 1+ (A) 05/01/2017 1442   PROTEINUR NEGATIVE 04/28/2017 0320   NITRITE Negative 05/01/2017 1442   NITRITE NEGATIVE 04/28/2017 0320   LEUKOCYTESUR 1+ (A) 05/01/2017 1442    Pertinent Imaging: CT reviewed as above.  Assessment & Plan:    1. Left ureteral stone 2. Nonobstructing left renal stone I discussed with the patient's our plans for cystoscopy, left ureteroscopy, laser lithotripsy, left ureteral stent. All risks benefits and indications the procedure described. We will also plan to remove his left nonobstructing stone. All questions were answered. The patient agreed to proceed.  No Follow-up on file.  Nickie Retort,  MD  [facility] [address], [place], [place] [address] [phone]

## 2017-05-05 ENCOUNTER — Ambulatory Visit: Payer: Managed Care, Other (non HMO) | Admitting: Certified Registered Nurse Anesthetist

## 2017-05-05 ENCOUNTER — Encounter
Admission: RE | Disposition: A | Discharge status: Home / Self Care | Payer: Self-pay | Source: Ambulatory Visit | Attending: Urology

## 2017-05-05 ENCOUNTER — Encounter: Payer: Self-pay | Admitting: *Deleted

## 2017-05-05 ENCOUNTER — Ambulatory Visit
Admission: RE | Admit: 2017-05-05 | Discharge: 2017-05-05 | Disposition: A | Discharge status: Home / Self Care | Payer: Managed Care, Other (non HMO) | Source: Ambulatory Visit | Attending: Urology | Admitting: Urology

## 2017-05-05 DIAGNOSIS — N202 Calculus of kidney with calculus of ureter: Secondary | ICD-10-CM | POA: Insufficient documentation

## 2017-05-05 DIAGNOSIS — Z720 Tobacco use: Secondary | ICD-10-CM | POA: Insufficient documentation

## 2017-05-05 DIAGNOSIS — Z79899 Other long term (current) drug therapy: Secondary | ICD-10-CM | POA: Insufficient documentation

## 2017-05-05 DIAGNOSIS — N359 Urethral stricture, unspecified: Secondary | ICD-10-CM | POA: Insufficient documentation

## 2017-05-05 DIAGNOSIS — K219 Gastro-esophageal reflux disease without esophagitis: Secondary | ICD-10-CM | POA: Insufficient documentation

## 2017-05-05 DIAGNOSIS — G4733 Obstructive sleep apnea (adult) (pediatric): Secondary | ICD-10-CM | POA: Insufficient documentation

## 2017-05-05 DIAGNOSIS — Z880 Allergy status to penicillin: Secondary | ICD-10-CM | POA: Insufficient documentation

## 2017-05-05 DIAGNOSIS — I252 Old myocardial infarction: Secondary | ICD-10-CM | POA: Insufficient documentation

## 2017-05-05 DIAGNOSIS — E119 Type 2 diabetes mellitus without complications: Secondary | ICD-10-CM | POA: Insufficient documentation

## 2017-05-05 DIAGNOSIS — N201 Calculus of ureter: Secondary | ICD-10-CM

## 2017-05-05 DIAGNOSIS — Z7982 Long term (current) use of aspirin: Secondary | ICD-10-CM | POA: Insufficient documentation

## 2017-05-05 DIAGNOSIS — I2510 Atherosclerotic heart disease of native coronary artery without angina pectoris: Secondary | ICD-10-CM | POA: Insufficient documentation

## 2017-05-05 DIAGNOSIS — Z7984 Long term (current) use of oral hypoglycemic drugs: Secondary | ICD-10-CM | POA: Insufficient documentation

## 2017-05-05 HISTORY — PX: URETEROSCOPY WITH HOLMIUM LASER LITHOTRIPSY: SHX6645

## 2017-05-05 HISTORY — PX: CYSTOSCOPY W/ URETERAL STENT PLACEMENT: SHX1429

## 2017-05-05 LAB — GLUCOSE, CAPILLARY
GLUCOSE-CAPILLARY: 136 mg/dL — AB (ref 65–99)
Glucose-Capillary: 115 mg/dL — ABNORMAL HIGH (ref 65–99)

## 2017-05-05 SURGERY — URETEROSCOPY, WITH LITHOTRIPSY USING HOLMIUM LASER
Anesthesia: General | Laterality: Left

## 2017-05-05 MED ORDER — FENTANYL CITRATE (PF) 100 MCG/2ML IJ SOLN: 25.0000 ug | INTRAMUSCULAR | Status: DC | PRN

## 2017-05-05 MED ORDER — ROCURONIUM BROMIDE 100 MG/10ML IV SOLN
INTRAVENOUS | Status: DC | PRN
  Administered 2017-05-05: 10 mg via INTRAVENOUS
  Administered 2017-05-05: 20 mg via INTRAVENOUS
  Administered 2017-05-05: 50 mg via INTRAVENOUS

## 2017-05-05 MED ORDER — SUGAMMADEX SODIUM 200 MG/2ML IV SOLN
INTRAVENOUS | Status: AC
  Filled 2017-05-05: qty 2

## 2017-05-05 MED ORDER — PROPOFOL 10 MG/ML IV BOLUS
INTRAVENOUS | Status: AC
  Filled 2017-05-05: qty 20

## 2017-05-05 MED ORDER — DEXAMETHASONE SODIUM PHOSPHATE 10 MG/ML IJ SOLN
INTRAMUSCULAR | Status: AC
  Filled 2017-05-05: qty 1

## 2017-05-05 MED ORDER — PROPOFOL 10 MG/ML IV BOLUS
INTRAVENOUS | Status: DC | PRN
  Administered 2017-05-05: 200 mg via INTRAVENOUS

## 2017-05-05 MED ORDER — MIDAZOLAM HCL 2 MG/2ML IJ SOLN
INTRAMUSCULAR | Status: DC | PRN
  Administered 2017-05-05: 2 mg via INTRAVENOUS

## 2017-05-05 MED ORDER — ROCURONIUM BROMIDE 50 MG/5ML IV SOLN
INTRAVENOUS | Status: AC
  Filled 2017-05-05: qty 1

## 2017-05-05 MED ORDER — CIPROFLOXACIN IN D5W 400 MG/200ML IV SOLN
INTRAVENOUS | Status: AC
  Filled 2017-05-05: qty 200

## 2017-05-05 MED ORDER — MIDAZOLAM HCL 2 MG/2ML IJ SOLN
INTRAMUSCULAR | Status: AC
  Filled 2017-05-05: qty 2

## 2017-05-05 MED ORDER — ONDANSETRON HCL 4 MG/2ML IJ SOLN
INTRAMUSCULAR | Status: DC | PRN
  Administered 2017-05-05: 4 mg via INTRAVENOUS

## 2017-05-05 MED ORDER — DEXAMETHASONE SODIUM PHOSPHATE 10 MG/ML IJ SOLN
INTRAMUSCULAR | Status: DC | PRN
Start: 2017-05-05 — End: 2017-05-05
  Administered 2017-05-05: 5 mg via INTRAVENOUS

## 2017-05-05 MED ORDER — ONDANSETRON HCL 4 MG/2ML IJ SOLN
INTRAMUSCULAR | Status: AC
  Filled 2017-05-05: qty 2

## 2017-05-05 MED ORDER — FENTANYL CITRATE (PF) 100 MCG/2ML IJ SOLN
INTRAMUSCULAR | Status: AC
  Filled 2017-05-05: qty 2

## 2017-05-05 MED ORDER — SUGAMMADEX SODIUM 200 MG/2ML IV SOLN
INTRAVENOUS | Status: DC | PRN
  Administered 2017-05-05: 300 mg via INTRAVENOUS

## 2017-05-05 MED ORDER — OXYCODONE HCL 5 MG PO TABS
7.5000 mg | ORAL_TABLET | Freq: Four times a day (QID) | ORAL | Status: DC | PRN
  Administered 2017-05-05: 7.5 mg via ORAL
  Filled 2017-05-05 (×2): qty 2

## 2017-05-05 MED ORDER — LIDOCAINE HCL (CARDIAC) 20 MG/ML IV SOLN
INTRAVENOUS | Status: DC | PRN
  Administered 2017-05-05: 50 mg via INTRAVENOUS

## 2017-05-05 MED ORDER — PROMETHAZINE HCL 25 MG/ML IJ SOLN: 6.2500 mg | INTRAMUSCULAR | Status: DC | PRN

## 2017-05-05 MED ORDER — CIPROFLOXACIN HCL 500 MG PO TABS: 500.0000 mg | ORAL_TABLET | Freq: Two times a day (BID) | ORAL | 0 refills | Status: DC

## 2017-05-05 MED ORDER — SUCCINYLCHOLINE CHLORIDE 20 MG/ML IJ SOLN
INTRAMUSCULAR | Status: DC | PRN
  Administered 2017-05-05: 100 mg via INTRAVENOUS

## 2017-05-05 MED ORDER — OXYCODONE HCL 5 MG PO TABS
ORAL_TABLET | ORAL | Status: AC
  Administered 2017-05-05: 7.5 mg via ORAL
  Filled 2017-05-05: qty 2

## 2017-05-05 MED ORDER — SODIUM CHLORIDE 0.9 % IV SOLN
INTRAVENOUS | Status: DC
  Administered 2017-05-05: 08:00:00 via INTRAVENOUS

## 2017-05-05 MED ORDER — LACTATED RINGERS IV SOLN
INTRAVENOUS | Status: DC
  Administered 2017-05-05: 08:00:00 via INTRAVENOUS

## 2017-05-05 MED ORDER — FENTANYL CITRATE (PF) 100 MCG/2ML IJ SOLN
INTRAMUSCULAR | Status: DC | PRN
  Administered 2017-05-05: 50 ug via INTRAVENOUS

## 2017-05-05 MED ORDER — LIDOCAINE HCL (PF) 2 % IJ SOLN
INTRAMUSCULAR | Status: AC
  Filled 2017-05-05: qty 2

## 2017-05-05 MED ORDER — PHENYLEPHRINE HCL 10 MG/ML IJ SOLN
INTRAMUSCULAR | Status: DC | PRN
  Administered 2017-05-05 (×2): 100 ug via INTRAVENOUS
  Administered 2017-05-05: 50 ug via INTRAVENOUS

## 2017-05-05 MED ORDER — OXYCODONE HCL 15 MG PO TABS: 7.5000 mg | ORAL_TABLET | Freq: Four times a day (QID) | ORAL | 0 refills | Status: DC | PRN

## 2017-05-05 SURGICAL SUPPLY — 30 items
BACTOSHIELD CHG 4% 4OZ (MISCELLANEOUS) ×2
BASKET STONE 1.9X4X120X12 (MISCELLANEOUS) IMPLANT
BASKET ZERO TIP 1.9FR (BASKET) ×3 IMPLANT
CATH URETL 5X70 OPEN END (CATHETERS) ×3 IMPLANT
CNTNR SPEC 2.5X3XGRAD LEK (MISCELLANEOUS) ×1
CONT SPEC 4OZ STER OR WHT (MISCELLANEOUS) ×2
CONTAINER SPEC 2.5X3XGRAD LEK (MISCELLANEOUS) ×1 IMPLANT
FEE TECHNICIAN ONLY PER HOUR (MISCELLANEOUS) IMPLANT
FIBER LASER LITHO 273 (Laser) ×3 IMPLANT
GLOVE BIO SURGEON STRL SZ7 (GLOVE) ×3 IMPLANT
GLOVE BIO SURGEON STRL SZ7.5 (GLOVE) ×3 IMPLANT
GOWN STRL REUS W/ TWL LRG LVL4 (GOWN DISPOSABLE) ×1 IMPLANT
GOWN STRL REUS W/TWL LRG LVL4 (GOWN DISPOSABLE) ×2
GOWN STRL REUS W/TWL XL LVL3 (GOWN DISPOSABLE) ×3 IMPLANT
GUIDEWIRE SUPER STIFF (WIRE) IMPLANT
INTRODUCER DILATOR DOUBLE (INTRODUCER) ×3 IMPLANT
KIT RM TURNOVER CYSTO AR (KITS) ×3 IMPLANT
PACK CYSTO AR (MISCELLANEOUS) ×3 IMPLANT
SCRUB CHG 4% DYNA-HEX 4OZ (MISCELLANEOUS) ×1 IMPLANT
SENSORWIRE 0.038 NOT ANGLED (WIRE) ×3
SET CYSTO W/LG BORE CLAMP LF (SET/KITS/TRAYS/PACK) ×3 IMPLANT
SHEATH URETERAL 13/15X36 1L (SHEATH) ×3 IMPLANT
SOL .9 NS 3000ML IRR  AL (IV SOLUTION) ×2
SOL .9 NS 3000ML IRR UROMATIC (IV SOLUTION) ×1 IMPLANT
STENT URET 6FRX24 CONTOUR (STENTS) IMPLANT
STENT URET 6FRX26 CONTOUR (STENTS) IMPLANT
SURGILUBE 2OZ TUBE FLIPTOP (MISCELLANEOUS) ×3 IMPLANT
SYRINGE IRR TOOMEY STRL 70CC (SYRINGE) ×3 IMPLANT
WATER STERILE IRR 1000ML POUR (IV SOLUTION) ×3 IMPLANT
WIRE SENSOR 0.038 NOT ANGLED (WIRE) ×1 IMPLANT

## 2017-05-05 NOTE — Transfer of Care (Signed)
Immediate Anesthesia Transfer of Care Note  Patient: Lance Decker  Procedure(s) Performed: Procedure(s): URETEROSCOPY WITH HOLMIUM LASER LITHOTRIPSY (Left) CYSTOSCOPY WITH STENT REPLACEMENT (Left)  Patient Location: PACU  Anesthesia Type:General  Level of Consciousness: awake, alert , oriented and patient cooperative  Airway & Oxygen Therapy: Patient Spontanous Breathing and Patient connected to nasal cannula oxygen  Post-op Assessment: Report given to RN and Post -op Vital signs reviewed and stable  Post vital signs: Reviewed and stable  Last Vitals:  Vitals:   05/05/17 0808 05/05/17 0956  BP: 120/86   Pulse: 83 81  Resp: 20 14  Temp: 36.6 C     Last Pain:  Vitals:   05/05/17 0808  TempSrc: Oral  PainSc: 2          Complications: No apparent anesthesia complications

## 2017-05-05 NOTE — Interval H&P Note (Signed)
History and Physical Interval Note:  05/05/2017 8:20 AM  Lance Decker  has presented today for surgery, with the diagnosis of LEFT URETERAL STONE  The various methods of treatment have been discussed with the patient and family. After consideration of risks, benefits and other options for treatment, the patient has consented to  Procedure(s): URETEROSCOPY WITH HOLMIUM LASER LITHOTRIPSY (Left) CYSTOSCOPY WITH STENT REPLACEMENT (Left) as a surgical intervention .  The patient's history has been reviewed, patient examined, no change in status, stable for surgery.  I have reviewed the patient's chart and labs.  Questions were answered to the patient's satisfaction.    RRR Lungs clear  Nickie Retort

## 2017-05-05 NOTE — Discharge Instructions (Signed)
  AMBULATORY SURGERY  DISCHARGE INSTRUCTIONS   1) The drugs that you were given will stay in your system until tomorrow so for the next 24 hours you should not:  A) Drive an automobile B) Make any legal decisions C) Drink any alcoholic beverage   2) You may resume regular meals tomorrow.  Today it is better to start with liquids and gradually work up to solid foods.  You may eat anything you prefer, but it is better to start with liquids, then soup and crackers, and gradually work up to solid foods.   3) Please notify your doctor immediately if you have any unusual bleeding, trouble breathing, redness and pain at the surgery site, drainage, fever, or pain not relieved by medication.    4) Additional Instructions: TAKE A STOOL SOFTENER TWICE A DAY WHILE TAKING NARCOTIC PAIN MEDICINE TO PREVENT CONSTIPATION   Please contact your physician with any problems or Same Day Surgery at [phone], Monday through Friday 6 am to 4 pm, or [place] [facility] [place] Main number at [phone].   

## 2017-05-05 NOTE — Anesthesia Post-op Follow-up Note (Cosign Needed)
Anesthesia QCDR form completed.        

## 2017-05-05 NOTE — Anesthesia Preprocedure Evaluation (Signed)
Anesthesia Evaluation  Patient identified by MRN, date of birth, ID band Patient awake    Reviewed: Allergy & Precautions, H&P , NPO status , Patient's Chart, lab work & pertinent test results, reviewed documented beta blocker date and time   History of Anesthesia Complications Negative for: history of anesthetic complications  Airway Mallampati: IV  TM Distance: >3 FB Neck ROM: full    Dental  (+) Dental Advidsory Given, Caps, Teeth Intact, Missing   Pulmonary neg shortness of breath, sleep apnea , neg COPD, neg recent URI,           Cardiovascular Exercise Tolerance: Good (-) hypertension(-) angina+ CAD and + Past MI  (-) Cardiac Stents and (-) CABG (-) dysrhythmias (-) Valvular Problems/Murmurs     Neuro/Psych negative neurological ROS  negative psych ROS   GI/Hepatic Neg liver ROS, GERD  ,  Endo/Other  diabetesMorbid obesity  Renal/GU negative Renal ROS  negative genitourinary   Musculoskeletal   Abdominal   Peds  Hematology negative hematology ROS (+)   Anesthesia Other Findings Past Medical History: No date: CAD (coronary artery disease) No date: Diabetes mellitus without complication ([facility]) No date: GERD (gastroesophageal reflux disease) No date: H/O alcohol abuse     Comment:  none since 1992 2002: MI (myocardial infarction) ([facility]) No date: OSA (obstructive sleep apnea)   Reproductive/Obstetrics negative OB ROS                             Anesthesia Physical  Anesthesia Plan  ASA: III  Anesthesia Plan: General   Post-op Pain Management:    Induction: Intravenous  PONV Risk Score and Plan: 2 and Ondansetron and Dexamethasone  Airway Management Planned: Oral ETT  Additional Equipment:   Intra-op Plan:   Post-operative Plan: Extubation in OR  Informed Consent: I have reviewed the patients History and Physical, chart, labs and discussed the procedure including the  risks, benefits and alternatives for the proposed anesthesia with the patient or authorized representative who has indicated his/her understanding and acceptance.   Dental Advisory Given  Plan Discussed with: Anesthesiologist, CRNA and Surgeon  Anesthesia Plan Comments:         Anesthesia Quick Evaluation  

## 2017-05-05 NOTE — Anesthesia Procedure Notes (Addendum)
Procedure Name: Intubation Date/Time: 05/05/2017 8:48 AM Performed by: Darlyne Russian Pre-anesthesia Checklist: Patient identified, Emergency Drugs available, Suction available, Patient being monitored and Timeout performed Patient Re-evaluated:Patient Re-evaluated prior to induction Oxygen Delivery Method: Circle system utilized Preoxygenation: Pre-oxygenation with 100% oxygen Induction Type: IV induction Ventilation: Oral airway inserted - appropriate to patient size and Unable to mask ventilate Laryngoscope Size: McGraph and 4 Grade View: Grade I Tube type: Oral Tube size: 7.5 mm Number of attempts: 1 Airway Equipment and Method: Stylet and Video-laryngoscopy Placement Confirmation: ETT inserted through vocal cords under direct vision,  positive ETCO2 and breath sounds checked- equal and bilateral Secured at: 23 cm Tube secured with: Tape Dental Injury: Teeth and Oropharynx as per pre-operative assessment  Difficulty Due To: Difficulty was anticipated Future Recommendations: Recommend- induction with short-acting agent, and alternative techniques readily available Comments: Lage face with facial hair prevented effective mask ventilation even with 2 handed mask.

## 2017-05-05 NOTE — Op Note (Signed)
Date of procedure: 05/05/17  Preoperative diagnosis:  1. Left ureteral stone 2. Left renal stone   Postoperative diagnosis:  1. Same  2. Meatal stenosis  Procedure: 1. Cystoscopy 2. Left ureteroscopy 3. Laser lithotripsy 4. Stone basketing 5. Left retrograde pyelogram with interpretation 6. Left ureteral stent exchange 6 [place] by 26 cm 7. Meatal dilation  Surgeon: Baruch Gouty, MD  Anesthesia: General  Complications: None  Intraoperative findings: The patient's left ureteral stone had been placed off into the kidney. On nephroscopy was identified in the left lower pole near his other known stone. A stone fragment was removed for stone analysis. The remainder of the stone burden was dusted into pieces less than 2 mm.  EBL: None  Specimens: Left renal stone to pathology   Drains: Left 6 French by 26 cm double-J ureteral stent  Disposition: Stable to the postanesthesia care unit  Indication for procedure: The patient is a 51 y.o. male with a left ureteral stone and left renal stone was initially temporized the stent due to inability to navigate the stone. He presents today for definitive surgical management..  After reviewing the management options for treatment, the patient elected to proceed with the above surgical procedure(s). We have discussed the potential benefits and risks of the procedure, side effects of the proposed treatment, the likelihood of the patient achieving the goals of the procedure, and any potential problems that might occur during the procedure or recuperation. Informed consent has been obtained.  Description of procedure: The patient was met in the preoperative area. All risks, benefits, and indications of the procedure were described in great detail. The patient consented to the procedure. Preoperative antibiotics were given. The patient was taken to the operative theater. General anesthesia was induced per the anesthesia service. The patient was then  placed in the dorsal lithotomy position and prepped and draped in the usual sterile fashion. A preoperative timeout was called.   Attempt was made to place a 21 French 30 cystoscope per urethra. However the patient developed meatal stenosis as his prior procedure. Using male urethral sounds is meatus was dilated up to 28 [place]. The sounds were only advanced into the very distal urethra. The cystoscope was then easily placed per urethra atraumatically. The left ureteral stent was then seen and grasped with flexible graspers and brought to the level of the urethral meatus. A sensor wire was exchanged through the stent and the stent was removed. The sensor wire was confirmed to be in the left renal pelvis on fluoroscopy. Using a semirigid ureteroscope, left ureter was inspected. No stones were identified at this time. His previous proximal stone was likely migrated proximally back to the occluded used to ureteral manipulation. This point a second sensor wire was placed. Over the second sensor wire, a ureteral access sheath was then placed under fluoroscopy atraumatically. The flexible ureteroscope was then introduced into the proximal ureter and advanced to the left collecting system under direct visualization. Pan nephrostomy did reveal the previous left ureteral stone in the lower pole next to the known smaller left renal stone. The smaller stone was basketed the stone basket and removed and sent to pathology. The remaining stone in the left lower pole was unable to be grasped with the stone basket after multiple attempts due to his anatomy. Decision was then made just to dust the stone. The stone was dusted into small fragments at a frequency 30 Hz into the fragments were all less than 2 mm. Pan nephroscopy under retropyelogram guidance  didn't not reveal any further stone burden. Retrograde pyelogram showed no filling defects. The ureteral access sheath was then removed under direct visualization revealing no  trauma or stones within the left ureter. Cystoscope was then reassembled remaining sensor wire and a 6 [place] by 26 cm double-J renal stent was placed. A sensor wire was removed. A curl seen left renal pelvis under fluoroscopy and in the urinary bladder direct visualization. The patient's bladder was then drained his we'll continue anesthesia and transferred in stable condition to the post anesthesia care unit.  Plan: The patient will follow-up in one week for stent removal. He will undergo a renal ultrasound 1 month renal to rule out iatrogenic hydronephrosis. We will discuss his stone analysis when available.  Baruch Gouty, M.D.

## 2017-05-05 NOTE — H&P (View-Only) (Signed)
05/04/2017 8:25 AM   Lance Decker 1966/01/07 [id number]  Referring provider: Adin Hector, MD [facility] [place], [place] [address]  Chief Complaint  Patient presents with  . Nephrolithiasis    discuss surgery    HPI: The patient is a 51 year old male with 4x7 mm proximal left ureteral Status post left ureteral stent placement after his ureter was too narrow to safely perform ureteroscopy last week. He presents today to discuss definitive surgical management which is tentatively scheduled for tomorrow. He also has a small left lower pole stone as well which was also not addressed during his last procedure.    PMH: Past Medical History:  Diagnosis Date  . CAD (coronary artery disease)   . Diabetes mellitus without complication ([place])   . GERD (gastroesophageal reflux disease)   . H/O alcohol abuse    none since 1992  . MI (myocardial infarction) ([place]) 2002  . OSA (obstructive sleep apnea)     Surgical History: Past Surgical History:  Procedure Laterality Date  . ANAL FISSURECTOMY    . APPENDECTOMY    . CARDIAC CATHETERIZATION Left 03/02/2015   Procedure: Left Heart Cath and Coronary Angiography;  Surgeon: Yolonda Kida, MD;  Location: [facility];  Service: Cardiovascular;  Laterality: Left;  . CYSTOSCOPY/URETEROSCOPY/HOLMIUM LASER/STENT PLACEMENT Left 04/28/2017   Procedure: CYSTOSCOPY/URETEROSCOPY/HOLMIUM LASER/STENT PLACEMENT;  Surgeon: Hollice Espy, MD;  Location: [facility];  Service: Urology;  Laterality: Left;  . PILONIDAL CYST / SINUS EXCISION    . PILONIDAL CYST EXCISION N/A 05/12/2015   Procedure: CYST EXCISION PILONIDAL EXTENSIVE;  Surgeon: Leonie Green, MD;  Location: [facility];  Service: General;  Laterality: N/A;    Home Medications:  Allergies as of 05/04/2017      Reactions   Penicillins Anaphylaxis   Has patient had a PCN reaction causing immediate rash, facial/tongue/throat swelling, SOB or  lightheadedness with hypotension: Yes Has patient had a PCN reaction causing severe rash involving mucus membranes or skin necrosis: No Has patient had a PCN reaction that required hospitalization: Yes Has patient had a PCN reaction occurring within the last 10 years: No If all of the above answers are "NO", then may proceed with Cephalosporin use.      Medication List       Accurate as of 05/04/17  8:25 AM. Always use your most recent med list.          aspirin 81 MG tablet Take 81 mg by mouth daily.   docusate sodium 100 MG capsule Commonly known as:  COLACE Take 100 mg by mouth daily.   lactulose 10 GM/15ML solution Commonly known as:  CHRONULAC Take 30 mLs (20 g total) by mouth daily as needed for mild constipation.   metFORMIN 500 MG tablet Commonly known as:  GLUCOPHAGE Take 500 mg by mouth 2 (two) times daily.   multivitamin tablet Take 1 tablet by mouth daily.   omeprazole 20 MG capsule Commonly known as:  PRILOSEC Take 1 capsule (20 mg total) by mouth 2 (two) times daily before a meal. X 7 days   ondansetron 4 MG disintegrating tablet Commonly known as:  ZOFRAN ODT Take 1 tablet (4 mg total) by mouth every 8 (eight) hours as needed for nausea or vomiting.   oxybutynin 5 MG tablet Commonly known as:  DITROPAN Take 1 tablet (5 mg total) by mouth every 8 (eight) hours as needed for bladder spasms.   oxyCODONE 15 MG immediate  release tablet Commonly known as:  ROXICODONE Take 0.5 tablets (7.5 mg total) by mouth every 6 (six) hours as needed for moderate pain, severe pain or breakthrough pain.   tamsulosin 0.4 MG Caps capsule Commonly known as:  FLOMAX Take 1 capsule (0.4 mg total) by mouth daily.       Allergies:  Allergies  Allergen Reactions  . Penicillins Anaphylaxis    Has patient had a PCN reaction causing immediate rash, facial/tongue/throat swelling, SOB or lightheadedness with hypotension: Yes Has patient had a PCN reaction causing severe rash  involving mucus membranes or skin necrosis: No Has patient had a PCN reaction that required hospitalization: Yes Has patient had a PCN reaction occurring within the last 10 years: No If all of the above answers are "NO", then may proceed with Cephalosporin use.     Family History: Family History  Problem Relation Age of Onset  . Heart disease Father   . Diabetes Mother   . Diabetes Maternal Grandfather   . Diabetes Maternal Grandmother   . Prostate cancer Neg Hx   . Kidney cancer Neg Hx     Social History:  reports that he has never smoked. His smokeless tobacco use includes Snuff. He reports that he does not drink alcohol or use drugs.  ROS: UROLOGY Frequent Urination?: Yes Hard to postpone urination?: No Burning/pain with urination?: No Get up at night to urinate?: Yes Leakage of urine?: No Urine stream starts and stops?: No Trouble starting stream?: No Do you have to strain to urinate?: No Blood in urine?: No Urinary tract infection?: No Sexually transmitted disease?: No Injury to kidneys or bladder?: No Painful intercourse?: No Weak stream?: No Erection problems?: No Penile pain?: No  Gastrointestinal Nausea?: No Vomiting?: No Indigestion/heartburn?: No Diarrhea?: No Constipation?: Yes  Constitutional Fever: No Night sweats?: No Weight loss?: No Fatigue?: No  Skin Skin rash/lesions?: No Itching?: No  Eyes Blurred vision?: No Double vision?: No  Ears/Nose/Throat Sore throat?: No Sinus problems?: No  Hematologic/Lymphatic Swollen glands?: No Easy bruising?: No  Cardiovascular Leg swelling?: No Chest pain?: No  Respiratory Cough?: No Shortness of breath?: No  Endocrine Excessive thirst?: No  Musculoskeletal Back pain?: No Joint pain?: No  Neurological Headaches?: No Dizziness?: No  Psychologic Depression?: No Anxiety?: No  Physical Exam: BP 117/75   Pulse 76   Ht 6' (1.829 m)   Wt (!) 330 lb (149.7 kg)   BMI 44.76 kg/m    Constitutional:  Alert and oriented, No acute distress. HEENT: [place] AT, moist mucus membranes.  Trachea midline, no masses. Cardiovascular: No clubbing, cyanosis, or edema. Respiratory: Normal respiratory effort, no increased work of breathing. GI: Abdomen is soft, nontender, nondistended, no abdominal masses GU: No CVA tenderness.  Skin: No rashes, bruises or suspicious lesions. Lymph: No cervical or inguinal adenopathy. Neurologic: Grossly intact, no focal deficits, moving all 4 extremities. Psychiatric: Normal mood and affect.  Laboratory Data: Lab Results  Component Value Date   WBC 11.3 (H) 04/27/2017   HGB 13.8 04/27/2017   HCT 39.8 (L) 04/27/2017   MCV 83.3 04/27/2017   PLT 202 04/27/2017    Lab Results  Component Value Date   CREATININE 1.11 04/27/2017    No results found for: PSA  No results found for: TESTOSTERONE  Lab Results  Component Value Date   HGBA1C 5.4 04/27/2017    Urinalysis    Component Value Date/Time   COLORURINE YELLOW (A) 04/28/2017 0320   APPEARANCEUR Cloudy (A) 05/01/2017 1442   LABSPEC  1.035 (H) 04/28/2017 0320   PHURINE 6.0 04/28/2017 0320   GLUCOSEU Negative 05/01/2017 1442   HGBUR SMALL (A) 04/28/2017 0320   BILIRUBINUR Negative 05/01/2017 1442   KETONESUR 20 (A) 04/28/2017 0320   PROTEINUR 1+ (A) 05/01/2017 1442   PROTEINUR NEGATIVE 04/28/2017 0320   NITRITE Negative 05/01/2017 1442   NITRITE NEGATIVE 04/28/2017 0320   LEUKOCYTESUR 1+ (A) 05/01/2017 1442    Pertinent Imaging: CT reviewed as above.  Assessment & Plan:    1. Left ureteral stone 2. Nonobstructing left renal stone I discussed with the patient's our plans for cystoscopy, left ureteroscopy, laser lithotripsy, left ureteral stent. All risks benefits and indications the procedure described. We will also plan to remove his left nonobstructing stone. All questions were answered. The patient agreed to proceed.  No Follow-up on file.  Nickie Retort,  MD  [facility] [address], [place], [place] [address] [phone]

## 2017-05-06 NOTE — Anesthesia Postprocedure Evaluation (Signed)
Anesthesia Post Note  Patient: Lance Decker  Procedure(s) Performed: Procedure(s) (LRB): URETEROSCOPY WITH HOLMIUM LASER LITHOTRIPSY (Left) CYSTOSCOPY WITH STENT REPLACEMENT (Left)  Patient location during evaluation: PACU Anesthesia Type: General Level of consciousness: awake and alert Pain management: pain level controlled Vital Signs Assessment: post-procedure vital signs reviewed and stable Respiratory status: spontaneous breathing, nonlabored ventilation, respiratory function stable and patient connected to nasal cannula oxygen Cardiovascular status: blood pressure returned to baseline and stable Postop Assessment: no signs of nausea or vomiting Anesthetic complications: no     Last Vitals:  Vitals:   05/05/17 1036 05/05/17 1111  BP: (!) 143/87 134/70  Pulse: 66 70  Resp: 16 16  Temp: (!) 36.1 C     Last Pain:  Vitals:   05/05/17 1111  TempSrc:   PainSc: 4                  Martha Clan

## 2017-05-10 ENCOUNTER — Encounter: Payer: Self-pay | Admitting: Urology

## 2017-05-10 ENCOUNTER — Ambulatory Visit (INDEPENDENT_AMBULATORY_CARE_PROVIDER_SITE_OTHER): Payer: Managed Care, Other (non HMO) | Admitting: Urology

## 2017-05-10 VITALS — BP 108/69 | Ht 72.0 in | Wt 318.4 lb

## 2017-05-10 DIAGNOSIS — N200 Calculus of kidney: Secondary | ICD-10-CM | POA: Diagnosis not present

## 2017-05-10 LAB — MICROSCOPIC EXAMINATION

## 2017-05-10 LAB — URINALYSIS, COMPLETE
BILIRUBIN UA: NEGATIVE
GLUCOSE, UA: NEGATIVE
KETONES UA: NEGATIVE
Nitrite, UA: NEGATIVE
Urobilinogen, Ur: 0.2 mg/dL (ref 0.2–1.0)
pH, UA: 6 (ref 5.0–7.5)

## 2017-05-10 MED ORDER — LIDOCAINE HCL 2 % EX GEL
1.0000 "application " | Freq: Once | CUTANEOUS | Status: AC
  Administered 2017-05-10: 1 via URETHRAL

## 2017-05-10 MED ORDER — CIPROFLOXACIN HCL 500 MG PO TABS
500.0000 mg | ORAL_TABLET | Freq: Once | ORAL | Status: AC
  Administered 2017-05-10: 500 mg via ORAL

## 2017-05-10 NOTE — Progress Notes (Signed)
   05/10/17  CC: No chief complaint on file.   HPI: The patient is a 51 year old gentleman presents today for left ureteral stent removal after undergoing a staged left ureteroscopy for removal of a left upper ureteral stone and left lower pole renal stone.  This was his first stone episode.  There were no vitals taken for this visit. NED. A&Ox3.   No respiratory distress   Abd soft, NT, ND Normal phallus with bilateral descended testicles  Cystoscopy Procedure Note  Patient identification was confirmed, informed consent was obtained, and patient was prepped using Betadine solution.  Lidocaine jelly was administered per urethral meatus.    Preoperative abx where received prior to procedure.     Pre-Procedure: - Inspection reveals a normal caliber ureteral meatus.  Procedure: The flexible cystoscope was introduced without difficulty - No urethral strictures/lesions are present. Left ureteral stent removal flexible graspers per meatus atraumatically.  Post-Procedure: - Patient tolerated the procedure well  Assessment/ Plan:  1. Left ureteral/renal stone status post ureteroscopy The patient will follow-up in one month with a renal ultrasound prior to right iatrogenic hydronephrosis. We'll discuss his stone analysis when available.

## 2017-05-12 LAB — STONE ANALYSIS
CA PHOS CRY STONE QL IR: 5 %
Ca Oxalate,Monohydr.: 95 %
STONE WEIGHT KSTONE: 8 mg

## 2017-05-29 ENCOUNTER — Encounter: Payer: Self-pay | Admitting: Urology

## 2017-06-02 ENCOUNTER — Ambulatory Visit
Admission: RE | Admit: 2017-06-02 | Discharge: 2017-06-02 | Disposition: A | Discharge status: Home / Self Care | Payer: Managed Care, Other (non HMO) | Source: Ambulatory Visit | Attending: Urology | Admitting: Urology

## 2017-06-02 DIAGNOSIS — N200 Calculus of kidney: Secondary | ICD-10-CM | POA: Diagnosis not present

## 2017-06-07 ENCOUNTER — Encounter: Payer: Self-pay | Admitting: Urology

## 2017-06-07 ENCOUNTER — Ambulatory Visit (INDEPENDENT_AMBULATORY_CARE_PROVIDER_SITE_OTHER): Payer: Managed Care, Other (non HMO) | Admitting: Urology

## 2017-06-07 VITALS — BP 107/68 | HR 80 | Ht 72.0 in | Wt 330.7 lb

## 2017-06-07 DIAGNOSIS — N200 Calculus of kidney: Secondary | ICD-10-CM | POA: Diagnosis not present

## 2017-06-07 NOTE — Progress Notes (Signed)
06/07/2017 3:50 PM   Lance Decker [birth year] [id number]  Referring provider: Adin Hector, MD [place] [facility] [place], [place] [address]  Chief Complaint  Patient presents with  . Follow-up    Renal US results    HPI: The patient is a 51 year old gentleman who presents for post instrumentation renal ultrasound follow-up after undergoing a staged left ureteroscopy for a left 7 mm proximal stone and a left lower pole stone. His renal ultrasound shows no evidence of hydronephrosis.  His stone analysis was 95% calcium oxalate monohydrate 5% calcium phosphate.    Of note, the patient has started a new diet for his diabetes approximately 2 years ago. He states that he does eat approximately 5 servings of spinach daily.  This was his first stone event.  PMH: Past Medical History:  Diagnosis Date  . CAD (coronary artery disease)   . Diabetes mellitus without complication ([place])   . GERD (gastroesophageal reflux disease)   . H/O alcohol abuse    none since 1992  . MI (myocardial infarction) ([place]) 2002  . OSA (obstructive sleep apnea)     Surgical History: Past Surgical History:  Procedure Laterality Date  . ANAL FISSURECTOMY    . APPENDECTOMY    . CARDIAC CATHETERIZATION Left 03/02/2015   Procedure: Left Heart Cath and Coronary Angiography;  Surgeon: Yolonda Kida, MD;  Location: [place] [facility];  Service: Cardiovascular;  Laterality: Left;  . CYSTOSCOPY W/ URETERAL STENT PLACEMENT Left 05/05/2017   Procedure: CYSTOSCOPY WITH STENT REPLACEMENT;  Surgeon: Nickie Retort, MD;  Location: [facility];  Service: Urology;  Laterality: Left;  . CYSTOSCOPY/URETEROSCOPY/HOLMIUM LASER/STENT PLACEMENT Left 04/28/2017   Procedure: CYSTOSCOPY/URETEROSCOPY/HOLMIUM LASER/STENT PLACEMENT;  Surgeon: Hollice Espy, MD;  Location: [facility];  Service: Urology;  Laterality: Left;  . PILONIDAL CYST / SINUS EXCISION    . PILONIDAL CYST EXCISION N/A  05/12/2015   Procedure: CYST EXCISION PILONIDAL EXTENSIVE;  Surgeon: Leonie Green, MD;  Location: [facility];  Service: General;  Laterality: N/A;  . URETEROSCOPY WITH HOLMIUM LASER LITHOTRIPSY Left 05/05/2017   Procedure: URETEROSCOPY WITH HOLMIUM LASER LITHOTRIPSY;  Surgeon: Nickie Retort, MD;  Location: [facility];  Service: Urology;  Laterality: Left;    Home Medications:  Allergies as of 06/07/2017      Reactions   Penicillins Anaphylaxis   Has patient had a PCN reaction causing immediate rash, facial/tongue/throat swelling, SOB or lightheadedness with hypotension: Yes Has patient had a PCN reaction causing severe rash involving mucus membranes or skin necrosis: No Has patient had a PCN reaction that required hospitalization: Yes Has patient had a PCN reaction occurring within the last 10 years: No If all of the above answers are "NO", then may proceed with Cephalosporin use.      Medication List       Accurate as of 06/07/17  3:50 PM. Always use your most recent med list.          aspirin 81 MG tablet Take 81 mg by mouth daily.   docusate sodium 100 MG capsule Commonly known as:  COLACE Take 100 mg by mouth daily.   metFORMIN 500 MG tablet Commonly known as:  GLUCOPHAGE Take 500 mg by mouth 2 (two) times daily.   multivitamin tablet Take 1 tablet by mouth daily.   omeprazole 20 MG capsule Commonly known as:  PRILOSEC Take 1 capsule (20 mg total) by mouth 2 (two) times daily before a meal. X 7 days  Allergies:  Allergies  Allergen Reactions  . Penicillins Anaphylaxis    Has patient had a PCN reaction causing immediate rash, facial/tongue/throat swelling, SOB or lightheadedness with hypotension: Yes Has patient had a PCN reaction causing severe rash involving mucus membranes or skin necrosis: No Has patient had a PCN reaction that required hospitalization: Yes Has patient had a PCN reaction occurring within the last 10 years: No If all of the above  answers are "NO", then may proceed with Cephalosporin use.     Family History: Family History  Problem Relation Age of Onset  . Heart disease Father   . Diabetes Mother   . Diabetes Maternal Grandfather   . Diabetes Maternal Grandmother   . Prostate cancer Neg Hx   . Kidney cancer Neg Hx     Social History:  reports that he has never smoked. His smokeless tobacco use includes Snuff. He reports that he does not drink alcohol or use drugs.  ROS: UROLOGY Frequent Urination?: No Hard to postpone urination?: No Burning/pain with urination?: No Get up at night to urinate?: No Leakage of urine?: No Urine stream starts and stops?: No Trouble starting stream?: No Do you have to strain to urinate?: No Blood in urine?: No Urinary tract infection?: No Sexually transmitted disease?: No Injury to kidneys or bladder?: No Painful intercourse?: No Weak stream?: No Erection problems?: No Penile pain?: No  Gastrointestinal Nausea?: No Vomiting?: No Indigestion/heartburn?: No Diarrhea?: No Constipation?: No  Constitutional Fever: No Night sweats?: No Weight loss?: No Fatigue?: No  Skin Skin rash/lesions?: No Itching?: No  Eyes Blurred vision?: No Double vision?: No  Ears/Nose/Throat Sore throat?: No Sinus problems?: No  Hematologic/Lymphatic Swollen glands?: No Easy bruising?: No  Cardiovascular Leg swelling?: No Chest pain?: No  Respiratory Cough?: No Shortness of breath?: No  Endocrine Excessive thirst?: No  Musculoskeletal Back pain?: No Joint pain?: No  Neurological Headaches?: No Dizziness?: No  Psychologic Depression?: No Anxiety?: No  Physical Exam: BP 107/68 (BP Location: Left Arm, Patient Position: Sitting, Cuff Size: Normal)   Pulse 80   Ht 6' (1.829 m)   Wt (!) 330 lb 11.2 oz (150 kg)   BMI 44.85 kg/m   Constitutional:  Alert and oriented, No acute distress. HEENT: [place] AT, moist mucus membranes.  Trachea midline, no  masses. Cardiovascular: No clubbing, cyanosis, or edema. Respiratory: Normal respiratory effort, no increased work of breathing. GI: Abdomen is soft, nontender, nondistended, no abdominal masses GU: No CVA tenderness.  Skin: No rashes, bruises or suspicious lesions. Lymph: No cervical or inguinal adenopathy. Neurologic: Grossly intact, no focal deficits, moving all 4 extremities. Psychiatric: Normal mood and affect.  Laboratory Data: Lab Results  Component Value Date   WBC 11.3 (H) 04/27/2017   HGB 13.8 04/27/2017   HCT 39.8 (L) 04/27/2017   MCV 83.3 04/27/2017   PLT 202 04/27/2017    Lab Results  Component Value Date   CREATININE 1.11 04/27/2017    No results found for: PSA  No results found for: TESTOSTERONE  Lab Results  Component Value Date   HGBA1C 5.4 04/27/2017    Urinalysis    Component Value Date/Time   COLORURINE YELLOW (A) 04/28/2017 0320   APPEARANCEUR Clear 05/10/2017 1102   LABSPEC 1.035 (H) 04/28/2017 0320   PHURINE 6.0 04/28/2017 0320   GLUCOSEU Negative 05/10/2017 1102   HGBUR SMALL (A) 04/28/2017 0320   BILIRUBINUR Negative 05/10/2017 1102   KETONESUR 20 (A) 04/28/2017 0320   PROTEINUR 2+ (A) 05/10/2017 1102  PROTEINUR NEGATIVE 04/28/2017 0320   NITRITE Negative 05/10/2017 1102   NITRITE NEGATIVE 04/28/2017 0320   LEUKOCYTESUR Trace (A) 05/10/2017 1102    Pertinent Imaging: Renal ultrasound as above.  Assessment & Plan:    1. History of nephrolithiasis I went over the patient's renal ultrasound results as well as stone analysis. We discussed ways to prevent future stone formation. We went over the ABCs of stone formation booklet. I do think the patient's stone is likely from his change in diet of the last 2 years where he has increased his spillage and takedown was 5 servings daily. We did discuss that though spinach can be quite healthy gentleman, this high of intake is likely the source of his nephrolithiasis. He was instructed to  decrease his spinach intake.  Return if symptoms worsen or fail to improve.  Nickie Retort, MD  [facility] [address], [place], [place] [address] [phone]

## 2017-10-12 ENCOUNTER — Encounter: Payer: Self-pay | Admitting: Emergency Medicine

## 2017-10-12 ENCOUNTER — Emergency Department: Payer: Managed Care, Other (non HMO)

## 2017-10-12 ENCOUNTER — Emergency Department
Admission: EM | Admit: 2017-10-12 | Discharge: 2017-10-12 | Disposition: A | Discharge status: Home / Self Care | Payer: Managed Care, Other (non HMO) | Attending: Emergency Medicine | Admitting: Emergency Medicine

## 2017-10-12 DIAGNOSIS — R0789 Other chest pain: Secondary | ICD-10-CM

## 2017-10-12 DIAGNOSIS — E119 Type 2 diabetes mellitus without complications: Secondary | ICD-10-CM | POA: Diagnosis not present

## 2017-10-12 DIAGNOSIS — I2510 Atherosclerotic heart disease of native coronary artery without angina pectoris: Secondary | ICD-10-CM | POA: Diagnosis not present

## 2017-10-12 DIAGNOSIS — Z7982 Long term (current) use of aspirin: Secondary | ICD-10-CM | POA: Diagnosis not present

## 2017-10-12 DIAGNOSIS — J209 Acute bronchitis, unspecified: Secondary | ICD-10-CM | POA: Insufficient documentation

## 2017-10-12 DIAGNOSIS — R079 Chest pain, unspecified: Secondary | ICD-10-CM | POA: Diagnosis present

## 2017-10-12 LAB — CBC
HCT: 45.4 % (ref 40.0–52.0)
HEMOGLOBIN: 15.3 g/dL (ref 13.0–18.0)
MCH: 28.2 pg (ref 26.0–34.0)
MCHC: 33.7 g/dL (ref 32.0–36.0)
MCV: 83.7 fL (ref 80.0–100.0)
Platelets: 200 10*3/uL (ref 150–440)
RBC: 5.42 MIL/uL (ref 4.40–5.90)
RDW: 13.8 % (ref 11.5–14.5)
WBC: 7 10*3/uL (ref 3.8–10.6)

## 2017-10-12 LAB — BASIC METABOLIC PANEL
ANION GAP: 7 (ref 5–15)
BUN: 16 mg/dL (ref 6–20)
CALCIUM: 9 mg/dL (ref 8.9–10.3)
CO2: 24 mmol/L (ref 22–32)
Chloride: 106 mmol/L (ref 101–111)
Creatinine, Ser: 0.67 mg/dL (ref 0.61–1.24)
Glucose, Bld: 134 mg/dL — ABNORMAL HIGH (ref 65–99)
Potassium: 4.2 mmol/L (ref 3.5–5.1)
SODIUM: 137 mmol/L (ref 135–145)

## 2017-10-12 LAB — TROPONIN I

## 2017-10-12 LAB — FIBRIN DERIVATIVES D-DIMER (ARMC ONLY): Fibrin derivatives D-dimer (ARMC): 108.6 ng/mL (FEU) (ref 0.00–499.00)

## 2017-10-12 MED ORDER — FLUTICASONE-SALMETEROL 250-50 MCG/DOSE IN AEPB: 1.0000 | INHALATION_SPRAY | Freq: Two times a day (BID) | RESPIRATORY_TRACT | 0 refills | Status: AC

## 2017-10-12 MED ORDER — ALBUTEROL SULFATE HFA 108 (90 BASE) MCG/ACT IN AERS: 2.0000 | INHALATION_SPRAY | Freq: Four times a day (QID) | RESPIRATORY_TRACT | 2 refills | Status: AC | PRN

## 2017-10-12 MED ORDER — IBUPROFEN 600 MG PO TABS: 600.0000 mg | ORAL_TABLET | Freq: Four times a day (QID) | ORAL | 0 refills | Status: AC | PRN

## 2017-10-12 MED ORDER — IPRATROPIUM-ALBUTEROL 0.5-2.5 (3) MG/3ML IN SOLN
3.0000 mL | Freq: Once | RESPIRATORY_TRACT | Status: AC
  Administered 2017-10-12: 3 mL via RESPIRATORY_TRACT
  Filled 2017-10-12: qty 3

## 2017-10-12 NOTE — ED Provider Notes (Signed)
[facility] Emergency Department Provider Note ____________________________________________   First MD Initiated Contact with Patient 10/12/17 1850     (approximate)  I have reviewed the triage vital signs and the nursing notes.   HISTORY  Chief Complaint Chest Pain    HPI Lance Decker is a 51 y.o. male with past medical history as noted below who presents with chest pain, acute onset several hours ago, sharp and intermittent, substernal and sometimes radiating to the neck.  Patient reports when it started acutely, he had diaphoresis and shortness of breath.  He states that the associated symptoms have resolved, and he still has the pain but it is intermittent.  He states it is not specifically positional or pleuritic.  The patient reports that he has had a cough and URI type symptoms for the last 4 days, and has had multiple coughing episodes which have been very vigorous and lasting up to a few minutes, with a couple episodes of posttussive emesis.  He denies fever or chills.  He denies any leg pain or swelling.  Past Medical History:  Diagnosis Date  . CAD (coronary artery disease)   . Diabetes mellitus without complication ([place])   . GERD (gastroesophageal reflux disease)   . H/O alcohol abuse    none since 1992  . MI (myocardial infarction) ([place]) 2002  . OSA (obstructive sleep apnea)     Patient Active Problem List   Diagnosis Date Noted  . Intractable nausea and vomiting 04/28/2017  . Intractable pain   . Renal colic on left side   . BMI 40.0-44.9, adult ([place]) 01/25/2017  . Elevated liver enzymes 04/05/2016  . Pain in the chest 03/02/2015  . OSA treated with BiPAP 03/01/2015  . Diabetes mellitus, type 2 ([place]) 03/01/2015  . Diabetes mellitus without complication ([place]) 55/73/2202  . Hypogonadism male 08/05/2014    Past Surgical History:  Procedure Laterality Date  . ANAL FISSURECTOMY    . APPENDECTOMY    . CARDIAC CATHETERIZATION Left  03/02/2015   Procedure: Left Heart Cath and Coronary Angiography;  Surgeon: Yolonda Kida, MD;  Location: [place] [facility];  Service: Cardiovascular;  Laterality: Left;  . CYSTOSCOPY W/ URETERAL STENT PLACEMENT Left 05/05/2017   Procedure: CYSTOSCOPY WITH STENT REPLACEMENT;  Surgeon: Nickie Retort, MD;  Location: [facility];  Service: Urology;  Laterality: Left;  . CYSTOSCOPY/URETEROSCOPY/HOLMIUM LASER/STENT PLACEMENT Left 04/28/2017   Procedure: CYSTOSCOPY/URETEROSCOPY/HOLMIUM LASER/STENT PLACEMENT;  Surgeon: Hollice Espy, MD;  Location: [facility];  Service: Urology;  Laterality: Left;  . PILONIDAL CYST / SINUS EXCISION    . PILONIDAL CYST EXCISION N/A 05/12/2015   Procedure: CYST EXCISION PILONIDAL EXTENSIVE;  Surgeon: Leonie Green, MD;  Location: [facility];  Service: General;  Laterality: N/A;  . URETEROSCOPY WITH HOLMIUM LASER LITHOTRIPSY Left 05/05/2017   Procedure: URETEROSCOPY WITH HOLMIUM LASER LITHOTRIPSY;  Surgeon: Nickie Retort, MD;  Location: [facility];  Service: Urology;  Laterality: Left;    Prior to Admission medications   Medication Sig Start Date End Date Taking? Authorizing Provider  aspirin 81 MG tablet Take 81 mg by mouth daily.   Yes [provider]  docusate sodium (COLACE) 100 MG capsule Take 100 mg by mouth daily.   Yes [provider]  metFORMIN (GLUCOPHAGE) 500 MG tablet Take 500 mg by mouth 2 (two) times daily.    Yes [provider]  Multiple Vitamin (MULTIVITAMIN) tablet Take 1 tablet by mouth daily.   Yes [provider]  omeprazole (PRILOSEC) 20 MG capsule Take 1 capsule (20 mg total) by mouth 2 (two) times daily before a meal. X 7 days Patient taking differently: Take 20 mg by mouth daily.  03/02/15  Yes Adin Hector, MD    Allergies Penicillins  Family History  Problem Relation Age of Onset  . Heart disease Father   . Diabetes Mother   . Diabetes Maternal Grandfather   . Diabetes Maternal  Grandmother   . Prostate cancer Neg Hx   . Kidney cancer Neg Hx     Social History Social History   Tobacco Use  . Smoking status: Never Smoker  . Smokeless tobacco: Current User    Types: Snuff  Substance Use Topics  . Alcohol use: No  . Drug use: No    Review of Systems  Constitutional: No fever.  Eyes: No redness. ENT: Positive for neck pain. Cardiovascular: Positive for chest pain. Respiratory: Positive for resolved shortness of breath. Gastrointestinal: No nausea, no vomiting.   Genitourinary: Negative for flank pain.  Musculoskeletal: Negative for back pain. Skin: Negative for rash. Neurological: Negative for headache.   ____________________________________________   PHYSICAL EXAM:  VITAL SIGNS: ED Triage Vitals  Enc Vitals Group     BP 10/12/17 1549 127/78     Pulse Rate 10/12/17 1549 77     Resp 10/12/17 1549 16     Temp 10/12/17 1549 (!) 97.5 F (36.4 C)     Temp Source 10/12/17 1549 Oral     SpO2 10/12/17 1549 98 %     Weight 10/12/17 1549 (!) 333 lb (151 kg)     Height 10/12/17 1549 6' (1.829 m)     Head Circumference --      Peak Flow --      Pain Score 10/12/17 1548 6     Pain Loc --      Pain Edu? --      Excl. in [place]? --     Constitutional: Alert and oriented.  Relatively well appearing and in no acute distress. Eyes: Conjunctivae are normal.  Head: Atraumatic. Nose: Nasal congestion. Mouth/Throat: Mucous membranes are moist.   Neck: Normal range of motion.  Cardiovascular: Normal rate, regular rhythm. Grossly normal heart sounds.  Good peripheral circulation.  Chest wall nontender. Respiratory: Normal respiratory effort.  No retractions. Lungs CTAB. Gastrointestinal: No distention.  Genitourinary: No CVA tenderness. Musculoskeletal: No lower extremity edema.  Extremities warm and well perfused.  No calf or popliteal swelling or tenderness. Neurologic:  Normal speech and language. No gross focal neurologic deficits are appreciated.    Skin:  Skin is warm and dry. No rash noted. Psychiatric: Mood and affect are normal. Speech and behavior are normal.  ____________________________________________   LABS (all labs ordered are listed, but only abnormal results are displayed)  Labs Reviewed  BASIC METABOLIC PANEL - Abnormal; Notable for the following components:      Result Value   Glucose, Bld 134 (*)    All other components within normal limits  CBC  TROPONIN I  TROPONIN I  FIBRIN DERIVATIVES D-DIMER ([facility] ONLY)   ____________________________________________  EKG  ED ECG REPORT I, Arta Silence, the attending physician, personally viewed and interpreted this ECG.  Date: 10/12/2017 EKG Time: 1544 Rate: 81 Rhythm: normal sinus rhythm QRS Axis: normal Intervals: normal ST/T Wave abnormalities: normal Narrative Interpretation: no evidence of acute ischemia  ____________________________________________  RADIOLOGY  CXR: No focal infiltrate or other acute findings.  ____________________________________________   PROCEDURES  Procedure(s)  performed: No    Critical Care performed: No ____________________________________________   INITIAL IMPRESSION / ASSESSMENT AND PLAN / ED COURSE  Pertinent labs & imaging results that were available during my care of the patient were reviewed by me and considered in my medical decision making (see chart for details).  51 year old male with past medical history as noted above (patient has remote history of MI 15 years ago but had negative cath several years ago and has no stents) presents with atypical, sharp, intermittent chest pain over the last several hours.  The pain began after he has been having vigorous cough and URI symptoms for several days.  I reviewed the past medical records in epic; patient had admission for renal colic and vomiting 6 months ago.  He had a catheterization in 2016 which was negative.  The recent past medical records are otherwise  noncontributory.  On exam, patient is relatively well-appearing, the vital signs are normal, and the remainder the exam is unremarkable.  His EKG is normal.  Overall presentation is most consistent with musculoskeletal chest wall pain related to coughing, versus bronchitis.  Also consider GERD or other benign GI cause.  There is no clinical evidence for aortic dissection or other vascular etiology given the intermittent nature of the pain and patient's normal vital signs and well appearance.  I have low suspicion for ACS, however his risk is somewhat increased given the remote history of MI.  Patient has no PE risk factors but I cannot rule out by [facility] due to age; based on discussion with patient will obtain a d-dimer.  Plan for repeat troponin after 3 hours as well and trial of nebs.    ----------------------------------------- 8:26 PM on 10/12/2017 -----------------------------------------  Patient states that his symptoms actually have significantly improved after the albuterol nebulizer.  He reports his breathing is much improved and has had no recurrence of the pain.  The repeat troponin and d-dimer are both negative.  Therefore at this time the most likely diagnosis is bronchitis with musculoskeletal chest wall pain.  The patient feels well to go home.  I gave discharge instructions and return precautions and the patient expressed understanding.  He reports significant improvement after being placed on Advair when he had a similar episode a few years ago, so I will give a prescription both for albuterol and for Advair. ____________________________________________   FINAL CLINICAL IMPRESSION(S) / ED DIAGNOSES  Final diagnoses:  Acute bronchitis, unspecified organism  Atypical chest pain      NEW MEDICATIONS STARTED DURING THIS VISIT:  This SmartLink is deprecated. Use AVSMEDLIST instead to display the medication list for a patient.   Note:  This document was prepared using Dragon  voice recognition software and may include unintentional dictation errors.    Arta Silence, MD 10/12/17 2028

## 2017-10-12 NOTE — Discharge Instructions (Signed)
Return to the emergency department for new, worsening, or persistent chest pain, difficulty breathing, weakness or lightheadedness, leg pain or swelling, fever or chills, or any other new or worsening symptoms that concern you.  You should take the albuterol and Advair for the breathing, and can take the ibuprofen for pain.  Follow-up with your regular doctor within the next 1-2 weeks.

## 2017-10-12 NOTE — ED Triage Notes (Signed)
Pt in via POV with complaints of sudden onset of left side chest pain with radiation into neck, pt reports associated diaphoresis and shortness of breath.  Pt reports hx of MI in 2003.  Vitals WDL.  NAD noted at this time.

## 2018-02-03 ENCOUNTER — Other Ambulatory Visit: Payer: Self-pay

## 2018-02-03 ENCOUNTER — Emergency Department
Admission: EM | Admit: 2018-02-03 | Discharge: 2018-02-03 | Disposition: A | Discharge status: Home / Self Care | Payer: Managed Care, Other (non HMO) | Attending: Emergency Medicine | Admitting: Emergency Medicine

## 2018-02-03 ENCOUNTER — Emergency Department: Payer: Managed Care, Other (non HMO)

## 2018-02-03 DIAGNOSIS — E119 Type 2 diabetes mellitus without complications: Secondary | ICD-10-CM | POA: Diagnosis not present

## 2018-02-03 DIAGNOSIS — Z7984 Long term (current) use of oral hypoglycemic drugs: Secondary | ICD-10-CM | POA: Diagnosis not present

## 2018-02-03 DIAGNOSIS — I2510 Atherosclerotic heart disease of native coronary artery without angina pectoris: Secondary | ICD-10-CM | POA: Diagnosis not present

## 2018-02-03 DIAGNOSIS — R0789 Other chest pain: Secondary | ICD-10-CM | POA: Diagnosis not present

## 2018-02-03 DIAGNOSIS — Z7982 Long term (current) use of aspirin: Secondary | ICD-10-CM | POA: Insufficient documentation

## 2018-02-03 LAB — FIBRIN DERIVATIVES D-DIMER (ARMC ONLY): Fibrin derivatives D-dimer (ARMC): 125.16 ng/mL (FEU) (ref 0.00–499.00)

## 2018-02-03 LAB — COMPREHENSIVE METABOLIC PANEL
ALBUMIN: 4.4 g/dL (ref 3.5–5.0)
ALT: 120 U/L — ABNORMAL HIGH (ref 17–63)
AST: 94 U/L — AB (ref 15–41)
Alkaline Phosphatase: 75 U/L (ref 38–126)
Anion gap: 7 (ref 5–15)
BILIRUBIN TOTAL: 0.7 mg/dL (ref 0.3–1.2)
BUN: 15 mg/dL (ref 6–20)
CO2: 25 mmol/L (ref 22–32)
Calcium: 9.3 mg/dL (ref 8.9–10.3)
Chloride: 103 mmol/L (ref 101–111)
Creatinine, Ser: 0.68 mg/dL (ref 0.61–1.24)
GFR calc Af Amer: >60 mL/min (ref >60)
GFR calc non Af Amer: >60 mL/min (ref >60)
GLUCOSE: 185 mg/dL — AB (ref 65–99)
POTASSIUM: 4 mmol/L (ref 3.5–5.1)
SODIUM: 135 mmol/L (ref 135–145)
TOTAL PROTEIN: 7.7 g/dL (ref 6.5–8.1)

## 2018-02-03 LAB — CBC WITH DIFFERENTIAL/PLATELET
BASOS ABS: 0 10*3/uL (ref 0–0.1)
BASOS PCT: 1 %
EOS ABS: 0.1 10*3/uL (ref 0–0.7)
Eosinophils Relative: 1 %
HEMATOCRIT: 44.4 % (ref 40.0–52.0)
HEMOGLOBIN: 15.5 g/dL (ref 13.0–18.0)
Lymphocytes Relative: 16 %
Lymphs Abs: 1.3 10*3/uL (ref 1.0–3.6)
MCH: 29.2 pg (ref 26.0–34.0)
MCHC: 34.9 g/dL (ref 32.0–36.0)
MCV: 83.6 fL (ref 80.0–100.0)
Monocytes Absolute: 0.5 10*3/uL (ref 0.2–1.0)
Monocytes Relative: 7 %
NEUTROS ABS: 6.1 10*3/uL (ref 1.4–6.5)
NEUTROS PCT: 75 %
Platelets: 199 10*3/uL (ref 150–440)
RBC: 5.31 MIL/uL (ref 4.40–5.90)
RDW: 13.7 % (ref 11.5–14.5)
WBC: 8.1 10*3/uL (ref 3.8–10.6)

## 2018-02-03 LAB — TROPONIN I

## 2018-02-03 MED ORDER — FAMOTIDINE 20 MG PO TABS
40.0000 mg | ORAL_TABLET | Freq: Once | ORAL | Status: AC
  Administered 2018-02-03: 40 mg via ORAL
  Filled 2018-02-03: qty 2

## 2018-02-03 MED ORDER — GI COCKTAIL ~~LOC~~
30.0000 mL | ORAL | Status: AC
  Administered 2018-02-03: 30 mL via ORAL
  Filled 2018-02-03: qty 30

## 2018-02-03 MED ORDER — SUCRALFATE 1 G PO TABS: 1.0000 g | ORAL_TABLET | Freq: Four times a day (QID) | ORAL | 1 refills | Status: AC

## 2018-02-03 NOTE — Discharge Instructions (Addendum)
Your blood tests today were unremarkable. We are unable to find a cause for your chest pain today, but it does not appear to be anything serious. Follow up with your doctors for further evaluation of your symptoms.

## 2018-02-03 NOTE — ED Notes (Signed)
ED Provider at bedside. 

## 2018-02-03 NOTE — ED Triage Notes (Signed)
Pt c/o chest pain that started at 1pm - the pain is left sided and radiates into left shoulder - denies shortness of breath and dizziness - pt has hx of MI

## 2018-02-03 NOTE — ED Provider Notes (Signed)
[facility] Emergency Department Provider Note  ____________________________________________  Time seen: Approximately 6:54 PM  I have reviewed the triage vital signs and the nursing notes.   HISTORY  Chief Complaint Chest Pain    HPI Lance Decker is a 52 y.o. male who complains of chest pain that started at 1 PM today. He has a history of diabetes. He also reports in 2016 having a heart catheterization for a suspected heart attack, but when they did the catheter he was told that he did not have a heart attack and that his coronary arteries were fine and he did not have any stents placed.at that time they told him he had GERD and put him on a PPI which he has been compliant with.  Today he was at rest when the chest pain started. It is sharp, left chest radiating to the left shoulder, no aggravating or alleviating factors, not associated with shortness of breath diaphoresis or vomiting. It lasts for 1-2 seconds at a time and then goes away again and occurs frequently, every 5-10 minutes. it is moderate intensity.     Past Medical History:  Diagnosis Date  . CAD (coronary artery disease)   . Diabetes mellitus without complication ([place])   . GERD (gastroesophageal reflux disease)   . H/O alcohol abuse    none since 1992  . MI (myocardial infarction) ([place]) 2002  . OSA (obstructive sleep apnea)      Patient Active Problem List   Diagnosis Date Noted  . Intractable nausea and vomiting 04/28/2017  . Intractable pain   . Renal colic on left side   . BMI 40.0-44.9, adult ([place]) 01/25/2017  . Elevated liver enzymes 04/05/2016  . Pain in the chest 03/02/2015  . OSA treated with BiPAP 03/01/2015  . Diabetes mellitus, type 2 ([place]) 03/01/2015  . Diabetes mellitus without complication ([place]) 54/62/7035  . Hypogonadism male 08/05/2014     Past Surgical History:  Procedure Laterality Date  . ANAL FISSURECTOMY    . APPENDECTOMY    . CARDIAC CATHETERIZATION  Left 03/02/2015   Procedure: Left Heart Cath and Coronary Angiography;  Surgeon: Yolonda Kida, MD;  Location: [facility];  Service: Cardiovascular;  Laterality: Left;  . CYSTOSCOPY W/ URETERAL STENT PLACEMENT Left 05/05/2017   Procedure: CYSTOSCOPY WITH STENT REPLACEMENT;  Surgeon: Nickie Retort, MD;  Location: [facility];  Service: Urology;  Laterality: Left;  . CYSTOSCOPY/URETEROSCOPY/HOLMIUM LASER/STENT PLACEMENT Left 04/28/2017   Procedure: CYSTOSCOPY/URETEROSCOPY/HOLMIUM LASER/STENT PLACEMENT;  Surgeon: Hollice Espy, MD;  Location: [facility];  Service: Urology;  Laterality: Left;  . PILONIDAL CYST / SINUS EXCISION    . PILONIDAL CYST EXCISION N/A 05/12/2015   Procedure: CYST EXCISION PILONIDAL EXTENSIVE;  Surgeon: Leonie Green, MD;  Location: [facility];  Service: General;  Laterality: N/A;  . URETEROSCOPY WITH HOLMIUM LASER LITHOTRIPSY Left 05/05/2017   Procedure: URETEROSCOPY WITH HOLMIUM LASER LITHOTRIPSY;  Surgeon: Nickie Retort, MD;  Location: [facility];  Service: Urology;  Laterality: Left;     Prior to Admission medications   Medication Sig Start Date End Date Taking? Authorizing Provider  albuterol (PROVENTIL HFA;VENTOLIN HFA) 108 (90 Base) MCG/ACT inhaler Inhale 2 puffs into the lungs every 6 (six) hours as needed for wheezing or shortness of breath. 10/12/17  Yes Arta Silence, MD  aspirin 81 MG tablet Take 81 mg by mouth daily.   Yes [provider]  metFORMIN (GLUCOPHAGE) 500 MG tablet Take 500 mg by mouth 2 (two) times daily.  Yes [provider]  Multiple Vitamin (MULTIVITAMIN) tablet Take 1 tablet by mouth daily.   Yes [provider]  omeprazole (PRILOSEC) 20 MG capsule Take 1 capsule (20 mg total) by mouth 2 (two) times daily before a meal. X 7 days Patient taking differently: Take 20 mg by mouth daily.  03/02/15  Yes Tama High III, MD  Fluticasone-Salmeterol (ADVAIR DISKUS) 250-50 MCG/DOSE AEPB Inhale 1 puff  into the lungs 2 (two) times daily for 7 days. Patient not taking: Reported on 02/03/2018 10/12/17 10/19/17  Arta Silence, MD  ibuprofen (ADVIL,MOTRIN) 600 MG tablet Take 1 tablet (600 mg total) by mouth every 6 (six) hours as needed. Patient not taking: Reported on 02/03/2018 10/12/17   Arta Silence, MD  sucralfate (CARAFATE) 1 g tablet Take 1 tablet (1 g total) by mouth 4 (four) times daily. 02/03/18   Carrie Mew, MD     Allergies Penicillins   Family History  Problem Relation Age of Onset  . Heart disease Father   . Diabetes Mother   . Diabetes Maternal Grandfather   . Diabetes Maternal Grandmother   . Prostate cancer Neg Hx   . Kidney cancer Neg Hx     Social History Social History   Tobacco Use  . Smoking status: Never Smoker  . Smokeless tobacco: Current User    Types: Snuff  Substance Use Topics  . Alcohol use: No  . Drug use: No    Review of Systems  Constitutional:   No fever or chills.  ENT:   No sore throat. No rhinorrhea. Cardiovascular:  positive as above chest pain without lightheadedness or syncope. Respiratory:   No dyspnea or cough. Gastrointestinal:   Negative for abdominal pain, vomiting and diarrhea.  Musculoskeletal:   Negative for focal pain or swelling All other systems reviewed and are negative except as documented above in ROS and HPI.  ____________________________________________   PHYSICAL EXAM:  VITAL SIGNS: ED Triage Vitals  Enc Vitals Group     BP 02/03/18 1620 139/81     Pulse Rate 02/03/18 1620 91     Resp 02/03/18 1620 18     Temp 02/03/18 1620 97.7 F (36.5 C)     Temp Source 02/03/18 1620 Oral     SpO2 02/03/18 1620 96 %     Weight 02/03/18 1616 (!) 343 lb (155.6 kg)     Height 02/03/18 1616 6' (1.829 m)     Head Circumference --      Peak Flow --      Pain Score 02/03/18 1615 8     Pain Loc --      Pain Edu? --      Excl. in [place]? --     Vital signs reviewed, nursing assessments  reviewed.   Constitutional:   Alert and oriented. Well appearing and in no distress. Eyes:   Conjunctivae are normal. EOMI. PERRL. ENT      Head:   Normocephalic and atraumatic.      Nose:   No congestion/rhinnorhea.       Mouth/Throat:   MMM, no pharyngeal erythema. No peritonsillar mass.       Neck:   No meningismus. Full ROM. Hematological/Lymphatic/Immunilogical:   No cervical lymphadenopathy. Cardiovascular:   RRR. Symmetric bilateral radial and DP pulses.  No murmurs.  Respiratory:   Normal respiratory effort without tachypnea/retractions. Breath sounds are clear and equal bilaterally. No wheezes/rales/rhonchi. Gastrointestinal:   Soft and nontender. Non distended. There is no CVA tenderness.  No  rebound, rigidity, or guarding. Genitourinary:   deferred Musculoskeletal:   Normal range of motion in all extremities. No joint effusions.  No lower extremity tenderness.  No edema. Neurologic:   Normal speech and language.  Motor grossly intact. No acute focal neurologic deficits are appreciated.  Skin:    Skin is warm, dry and intact. No rash noted.  No petechiae, purpura, or bullae.  ____________________________________________    LABS (pertinent positives/negatives) (all labs ordered are listed, but only abnormal results are displayed) Labs Reviewed  COMPREHENSIVE METABOLIC PANEL - Abnormal; Notable for the following components:      Result Value   Glucose, Bld 185 (*)    AST 94 (*)    ALT 120 (*)    All other components within normal limits  TROPONIN I  CBC WITH DIFFERENTIAL/PLATELET  FIBRIN DERIVATIVES D-DIMER ([facility] ONLY)  TROPONIN I   ____________________________________________   EKG  interpreted by me  Date: 02/03/2018  Rate: 88  Rhythm: normal sinus rhythm  QRS Axis: normal  Intervals: normal  ST/T Wave abnormalities: normal  Conduction Disutrbances: none  Narrative Interpretation: unremarkable      ____________________________________________     RADIOLOGY  Dg Chest 2 View  Result Date: 02/03/2018 CLINICAL DATA:  Chest pain for several hours on the left EXAM: CHEST - 2 VIEW COMPARISON:  10/12/2017 FINDINGS: Cardiac shadow is within normal limits. The lungs are well aerated bilaterally. No focal infiltrate or sizable effusion is seen. No bony abnormality is noted. IMPRESSION: No active cardiopulmonary disease. Electronically Signed   By: Inez Catalina M.D.   On: 02/03/2018 17:00    ____________________________________________   PROCEDURES Procedures  ____________________________________________  DIFFERENTIAL DIAGNOSIS   non-STEMI, GERD, atypical chest pain  CLINICAL IMPRESSION / ASSESSMENT AND PLAN / ED COURSE  Pertinent labs & imaging results that were available during my care of the patient were reviewed by me and considered in my medical decision making (see chart for details).    patient presents with atypical chest pain, fleeting and sharp.Considering the patient's symptoms, medical history, and physical examination today, I have low suspicion for ACS, PE, TAD, pneumothorax, carditis, mediastinitis, pneumonia, CHF, or sepsis.  Check 2 troponins given his morbid obesity and underlying diabetes. Check d-dimer. In the meantime, I suspect this is related to GERD and will give him antacids.  Clinical Course as of Feb 03 2033  Sat Feb 03, 2018  1854 D-dimer neg. Awaiting second trop. If negative pt suitable for DC home to cardiology f/u.   Fibrin derivatives D-dimer [facility]): 125.16 [PS]    Clinical Course User Index [PS] Carrie Mew, MD     ----------------------------------------- 8:34 PM on 02/03/2018 -----------------------------------------  Repeat troponin negative. Vital signs remained normal. Discharge home follow up with primary care and cardiology.  ____________________________________________   FINAL CLINICAL IMPRESSION(S) / ED DIAGNOSES    Final diagnoses:  Atypical chest pain     ED  Discharge Orders        Ordered    sucralfate (CARAFATE) 1 g tablet  4 times daily     02/03/18 2033      Portions of this note were generated with dragon dictation software. Dictation errors may occur despite best attempts at proofreading.    Carrie Mew, MD 02/03/18 2034

## 2018-09-18 ENCOUNTER — Encounter: Payer: Self-pay | Admitting: *Deleted

## 2018-09-19 ENCOUNTER — Ambulatory Visit
Admission: RE | Admit: 2018-09-19 | Discharge: 2018-09-19 | Disposition: A | Discharge status: Home / Self Care | Payer: Managed Care, Other (non HMO) | Source: Ambulatory Visit | Attending: Internal Medicine | Admitting: Internal Medicine

## 2018-09-19 ENCOUNTER — Encounter
Admission: RE | Disposition: A | Discharge status: Home / Self Care | Payer: Self-pay | Source: Ambulatory Visit | Attending: Internal Medicine

## 2018-09-19 ENCOUNTER — Ambulatory Visit: Payer: Managed Care, Other (non HMO) | Admitting: Anesthesiology

## 2018-09-19 ENCOUNTER — Other Ambulatory Visit: Payer: Self-pay

## 2018-09-19 DIAGNOSIS — Z79899 Other long term (current) drug therapy: Secondary | ICD-10-CM | POA: Insufficient documentation

## 2018-09-19 DIAGNOSIS — I252 Old myocardial infarction: Secondary | ICD-10-CM | POA: Insufficient documentation

## 2018-09-19 DIAGNOSIS — Z7982 Long term (current) use of aspirin: Secondary | ICD-10-CM | POA: Diagnosis not present

## 2018-09-19 DIAGNOSIS — D125 Benign neoplasm of sigmoid colon: Secondary | ICD-10-CM | POA: Diagnosis not present

## 2018-09-19 DIAGNOSIS — I2510 Atherosclerotic heart disease of native coronary artery without angina pectoris: Secondary | ICD-10-CM | POA: Insufficient documentation

## 2018-09-19 DIAGNOSIS — K640 First degree hemorrhoids: Secondary | ICD-10-CM | POA: Insufficient documentation

## 2018-09-19 DIAGNOSIS — Z7984 Long term (current) use of oral hypoglycemic drugs: Secondary | ICD-10-CM | POA: Insufficient documentation

## 2018-09-19 DIAGNOSIS — G4733 Obstructive sleep apnea (adult) (pediatric): Secondary | ICD-10-CM | POA: Insufficient documentation

## 2018-09-19 DIAGNOSIS — E119 Type 2 diabetes mellitus without complications: Secondary | ICD-10-CM | POA: Insufficient documentation

## 2018-09-19 DIAGNOSIS — Z1211 Encounter for screening for malignant neoplasm of colon: Secondary | ICD-10-CM | POA: Insufficient documentation

## 2018-09-19 DIAGNOSIS — Z8371 Family history of colonic polyps: Secondary | ICD-10-CM | POA: Insufficient documentation

## 2018-09-19 DIAGNOSIS — K621 Rectal polyp: Secondary | ICD-10-CM | POA: Insufficient documentation

## 2018-09-19 HISTORY — DX: History of falling: Z91.81

## 2018-09-19 HISTORY — PX: COLONOSCOPY WITH PROPOFOL: SHX5780

## 2018-09-19 HISTORY — DX: Abnormal levels of other serum enzymes: R74.8

## 2018-09-19 HISTORY — DX: Chest pain, unspecified: R07.9

## 2018-09-19 HISTORY — DX: Personal history of other specified conditions: Z87.898

## 2018-09-19 HISTORY — DX: Testicular hypofunction: E29.1

## 2018-09-19 LAB — GLUCOSE, CAPILLARY: GLUCOSE-CAPILLARY: 151 mg/dL — AB (ref 70–99)

## 2018-09-19 SURGERY — COLONOSCOPY WITH PROPOFOL
Anesthesia: General

## 2018-09-19 MED ORDER — PROPOFOL 10 MG/ML IV BOLUS
INTRAVENOUS | Status: DC | PRN
  Administered 2018-09-19: 150 mg via INTRAVENOUS

## 2018-09-19 MED ORDER — LIDOCAINE HCL (PF) 1 % IJ SOLN
INTRAMUSCULAR | Status: AC
  Administered 2018-09-19: 0.3 mL
  Filled 2018-09-19: qty 2

## 2018-09-19 MED ORDER — LIDOCAINE HCL (CARDIAC) PF 100 MG/5ML IV SOSY
PREFILLED_SYRINGE | INTRAVENOUS | Status: DC | PRN
  Administered 2018-09-19: 100 mg via INTRAVENOUS

## 2018-09-19 MED ORDER — SODIUM CHLORIDE 0.9 % IV SOLN
INTRAVENOUS | Status: DC
  Administered 2018-09-19: 1000 mL via INTRAVENOUS

## 2018-09-19 MED ORDER — PROPOFOL 500 MG/50ML IV EMUL
INTRAVENOUS | Status: DC | PRN
  Administered 2018-09-19: 150 ug/kg/min via INTRAVENOUS

## 2018-09-19 NOTE — Interval H&P Note (Signed)
History and Physical Interval Note:  09/19/2018 10:12 AM  Lance Decker  has presented today for surgery, with the diagnosis of FM HX COLON POLYPS  The various methods of treatment have been discussed with the patient and family. After consideration of risks, benefits and other options for treatment, the patient has consented to  Procedure(s): COLONOSCOPY WITH PROPOFOL (N/A) as a surgical intervention .  The patient's history has been reviewed, patient examined, no change in status, stable for surgery.  I have reviewed the patient's chart and labs.  Questions were answered to the patient's satisfaction.     [place], [place]

## 2018-09-19 NOTE — Anesthesia Preprocedure Evaluation (Signed)
Anesthesia Evaluation  Patient identified by MRN, date of birth, ID band Patient awake    Reviewed: Allergy & Precautions, H&P , NPO status , Patient's Chart, lab work & pertinent test results, reviewed documented beta blocker date and time   History of Anesthesia Complications Negative for: history of anesthetic complications  Airway Mallampati: IV  TM Distance: >3 FB Neck ROM: full    Dental  (+) Dental Advidsory Given, Caps, Teeth Intact, Missing   Pulmonary neg shortness of breath, sleep apnea , neg COPD, neg recent URI,           Cardiovascular Exercise Tolerance: Good (-) hypertension(-) angina+ CAD and + Past MI  (-) Cardiac Stents and (-) CABG (-) dysrhythmias (-) Valvular Problems/Murmurs     Neuro/Psych negative neurological ROS  negative psych ROS   GI/Hepatic Neg liver ROS, GERD  ,  Endo/Other  diabetesMorbid obesity  Renal/GU negative Renal ROS  negative genitourinary   Musculoskeletal   Abdominal   Peds  Hematology negative hematology ROS (+)   Anesthesia Other Findings Past Medical History: No date: CAD (coronary artery disease) No date: Diabetes mellitus without complication (HCC) No date: GERD (gastroesophageal reflux disease) No date: H/O alcohol abuse     Comment:  none since 1992 2002: MI (myocardial infarction) ([place]) No date: OSA (obstructive sleep apnea)   Reproductive/Obstetrics negative OB ROS                             Anesthesia Physical  Anesthesia Plan  ASA: III  Anesthesia Plan: General   Post-op Pain Management:    Induction: Intravenous  PONV Risk Score and Plan: 2 and Ondansetron and Dexamethasone  Airway Management Planned: Oral ETT  Additional Equipment:   Intra-op Plan:   Post-operative Plan: Extubation in OR  Informed Consent: I have reviewed the patients History and Physical, chart, labs and discussed the procedure including the  risks, benefits and alternatives for the proposed anesthesia with the patient or authorized representative who has indicated his/her understanding and acceptance.   Dental Advisory Given  Plan Discussed with: Anesthesiologist, CRNA and Surgeon  Anesthesia Plan Comments:         Anesthesia Quick Evaluation

## 2018-09-19 NOTE — Transfer of Care (Signed)
Immediate Anesthesia Transfer of Care Note  Patient: Lance Decker  Procedure(s) Performed: COLONOSCOPY WITH PROPOFOL (N/A )  Patient Location: Endoscopy Unit  Anesthesia Type:General  Level of Consciousness: sedated  Airway & Oxygen Therapy: Patient Spontanous Breathing and Patient connected to nasal cannula oxygen  Post-op Assessment: Report given to RN and Post -op Vital signs reviewed and stable  Post vital signs: Reviewed and stable  Last Vitals:  Vitals Value Taken Time  BP 145/92 09/19/2018 11:21 AM  Temp    Pulse 84 09/19/2018 11:21 AM  Resp 16 09/19/2018 11:21 AM  SpO2 92 % 09/19/2018 11:21 AM  Vitals shown include unvalidated device data.  Last Pain:  Vitals:   09/19/18 1120  TempSrc: (P) Tympanic  PainSc:          Complications: No apparent anesthesia complications

## 2018-09-19 NOTE — Anesthesia Post-op Follow-up Note (Signed)
Anesthesia QCDR form completed.        

## 2018-09-19 NOTE — H&P (Signed)
Outpatient short stay form Pre-procedure 09/19/2018 10:10 AM Teodoro K. Alice Reichert, M.D.  Primary Physician: Ramonita Lab, M.D.  Reason for visit:  Family history of colon polyps.  History of present illness:                            Patient presents for colonoscopy for a familyl hx of colon polyps. The patient denies abdominal pain, abnormal weight loss or rectal bleeding.    No current facility-administered medications for this encounter.   Medications Prior to Admission  Medication Sig Dispense Refill Last Dose  . albuterol (PROVENTIL HFA;VENTOLIN HFA) 108 (90 Base) MCG/ACT inhaler Inhale 2 puffs into the lungs every 6 (six) hours as needed for wheezing or shortness of breath. 1 Inhaler 2 Past Month at Unknown time  . aspirin 81 MG tablet Take 81 mg by mouth daily.   Past Week at Unknown time  . ibuprofen (ADVIL,MOTRIN) 600 MG tablet Take 1 tablet (600 mg total) by mouth every 6 (six) hours as needed. 30 tablet 0 Past Month at Unknown time  . metFORMIN (GLUCOPHAGE) 500 MG tablet Take 500 mg by mouth 2 (two) times daily.    09/18/2018 at Unknown time  . Multiple Vitamin (MULTIVITAMIN) tablet Take 1 tablet by mouth daily.   Past Week at Unknown time  . Omega-3 Fatty Acids (OMEGA 3 PO) Take by mouth daily.   Past Week at Unknown time  . omeprazole (PRILOSEC) 20 MG capsule Take 1 capsule (20 mg total) by mouth 2 (two) times daily before a meal. X 7 days (Patient taking differently: Take 20 mg by mouth daily. ) 14 capsule 0 Past Week at Unknown time  . sucralfate (CARAFATE) 1 g tablet Take 1 tablet (1 g total) by mouth 4 (four) times daily. 120 tablet 1 Past Week at Unknown time  . Fluticasone-Salmeterol (ADVAIR DISKUS) 250-50 MCG/DOSE AEPB Inhale 1 puff into the lungs 2 (two) times daily for 7 days. (Patient not taking: Reported on 02/03/2018) 14 each 0 Completed Course at Unknown time  . RIBOFLAVIN PO Take by mouth daily.   Completed Course at Unknown time     Allergies  Allergen Reactions  .  Penicillins Anaphylaxis    Has patient had a PCN reaction causing immediate rash, facial/tongue/throat swelling, SOB or lightheadedness with hypotension: Yes Has patient had a PCN reaction causing severe rash involving mucus membranes or skin necrosis: No Has patient had a PCN reaction that required hospitalization: Yes Has patient had a PCN reaction occurring within the last 10 years: No If all of the above answers are "NO", then may proceed with Cephalosporin use.      Past Medical History:  Diagnosis Date  . CAD (coronary artery disease)   . Chest pain 02/2015  . Diabetes mellitus without complication ([place])   . Elevated liver enzymes 04/05/2016  . GERD (gastroesophageal reflux disease)   . H/O alcohol abuse    none since 1992  . History of fall 12/2009  . History of syncope 2002  . Hypogonadism male 08/05/2014  . MI (myocardial infarction) ([place]) 2002  . OSA (obstructive sleep apnea)     Review of systems:  Otherwise negative.    Physical Exam  Gen: Alert, oriented. Appears stated age.  HEENT: [place]/AT. PERRLA. Lungs: CTA, no wheezes. CV: RR nl S1, S2. Abd: soft, benign, no masses. BS+ Ext: No edema. Pulses 2+    Planned procedures: Proceed with colonoscopy. The patient understands the  nature of the planned procedure, indications, risks, alternatives and potential complications including but not limited to bleeding, infection, perforation, damage to internal organs and possible oversedation/side effects from anesthesia. The patient agrees and gives consent to proceed.  Please refer to procedure notes for findings, recommendations and patient disposition/instructions.     Teodoro K. Alice Reichert, M.D. Gastroenterology 09/19/2018  10:10 AM

## 2018-09-20 ENCOUNTER — Encounter: Payer: Self-pay | Admitting: Internal Medicine

## 2018-09-20 NOTE — Op Note (Signed)
[facility] Gastroenterology Patient Name: Lance Decker Procedure Date: 09/19/2018 10:14 AM MRN: [id number] Account #: [id number] Date of Birth: 03/15/1966 Admit Type: Outpatient Age: 52 Room: [facility] ENDO ROOM 3 Gender: Male Note Status: Finalized Procedure:            Colonoscopy Indications:          Colon cancer screening in patient at increased risk:                        Family history of colon polyps in multiple 1st-degree                        relatives Providers:            Benay Pike. Alice Reichert MD, MD Referring MD:         Ramonita Lab, MD (Referring MD) Medicines:            Propofol per Anesthesia Complications:        No immediate complications. Procedure:            Pre-Anesthesia Assessment:                       - The risks and benefits of the procedure and the                        sedation options and risks were discussed with the                        patient. All questions were answered and informed                        consent was obtained.                       - Patient identification and proposed procedure were                        verified prior to the procedure by the nurse. The                        procedure was verified in the procedure room.                       - ASA Grade Assessment: III - A patient with severe                        systemic disease.                       - After reviewing the risks and benefits, the patient                        was deemed in satisfactory condition to undergo the                        procedure.                       After obtaining informed consent, the colonoscope was  passed under direct vision. Throughout the procedure,                        the patient's blood pressure, pulse, and oxygen                        saturations were monitored continuously. The                        Colonoscope was introduced through the anus and                        advanced to the  the cecum, identified by appendiceal                        orifice and ileocecal valve. The colonoscopy was                        performed without difficulty. The patient tolerated the                        procedure well. The quality of the bowel preparation                        was good. The ileocecal valve, appendiceal orifice, and                        rectum were photographed. Findings:      The perianal and digital rectal examinations were normal. Pertinent       negatives include normal sphincter tone and no palpable rectal lesions.      Three sessile polyps were found in the rectum and sigmoid colon. The       polyps were 2 to 4 mm in size. These polyps were removed with a jumbo       cold forceps. Resection and retrieval were complete.      Non-bleeding internal hemorrhoids were found during retroflexion. The       hemorrhoids were Grade I (internal hemorrhoids that do not prolapse).      The exam was otherwise without abnormality. Impression:           - Three 2 to 4 mm polyps in the rectum and in the                        sigmoid colon, removed with a jumbo cold forceps.                        Resected and retrieved.                       - Non-bleeding internal hemorrhoids.                       - The examination was otherwise normal. Recommendation:       - Patient has a contact number available for                        emergencies. The signs and symptoms of potential  delayed complications were discussed with the patient.                        Return to normal activities tomorrow. Written discharge                        instructions were provided to the patient.                       - Resume previous diet.                       - Continue present medications.                       - Repeat colonoscopy date to be determined after                        pending pathology results are reviewed for surveillance.                       - Return  to GI office PRN.                       - The findings and recommendations were discussed with                        the patient and their family. Procedure Code(s):    --- Professional ---                       [phone], Colonoscopy, flexible; with biopsy, single or                        multiple Diagnosis Code(s):    --- Professional ---                       K64.0, First degree hemorrhoids                       D12.5, Benign neoplasm of sigmoid colon                       K62.1, Rectal polyp                       Z83.71, Family history of colonic polyps CPT copyright 2018 American Medical Association. All rights reserved. The codes documented in this report are preliminary and upon coder review may  be revised to meet current compliance requirements. Efrain Sella MD, MD 09/19/2018 11:19:32 AM This report has been signed electronically. Number of Addenda: 0 Note Initiated On: 09/19/2018 10:14 AM Scope Withdrawal Time: 0 hours 14 minutes 36 seconds  Total Procedure Duration: 0 hours 19 minutes 39 seconds       [facility]

## 2018-09-20 NOTE — Anesthesia Postprocedure Evaluation (Signed)
Anesthesia Post Note  Patient: Lance Decker  Procedure(s) Performed: COLONOSCOPY WITH PROPOFOL (N/A )  Patient location during evaluation: Endoscopy Anesthesia Type: General Level of consciousness: awake and alert Pain management: pain level controlled Vital Signs Assessment: post-procedure vital signs reviewed and stable Respiratory status: spontaneous breathing, nonlabored ventilation, respiratory function stable and patient connected to nasal cannula oxygen Cardiovascular status: blood pressure returned to baseline and stable Postop Assessment: no apparent nausea or vomiting Anesthetic complications: no     Last Vitals:  Vitals:   09/19/18 1130 09/19/18 1140  BP: 120/76 119/63  Pulse: 78 75  Resp: 12 15  Temp:    SpO2: 98% 98%    Last Pain:  Vitals:   09/19/18 1140  TempSrc:   PainSc: 0-No pain                 Martha Clan

## 2018-09-21 LAB — SURGICAL PATHOLOGY

## 2018-12-26 ENCOUNTER — Other Ambulatory Visit: Payer: Self-pay | Admitting: Physician Assistant

## 2018-12-26 DIAGNOSIS — R109 Unspecified abdominal pain: Secondary | ICD-10-CM

## 2018-12-26 DIAGNOSIS — R1032 Left lower quadrant pain: Secondary | ICD-10-CM

## 2019-06-18 IMAGING — CT CT RENAL STONE PROTOCOL
2 of 4 series · 16 of 46 positions shown, 18 images · non-contrast
Comparison: None.

CLINICAL DATA: Left flank pain starting yesterday.

EXAM:
CT ABDOMEN AND PELVIS WITHOUT CONTRAST
TECHNIQUE: Multidetector CT imaging of the abdomen and pelvis was performed
following the standard protocol without IV contrast.

[Series 2: stone full standard · axial · 0.89mm/px · z∈[-1067,-572]mm · 13 of 109 slices shown, 15 images]
[im 5/109  soft-tissue]
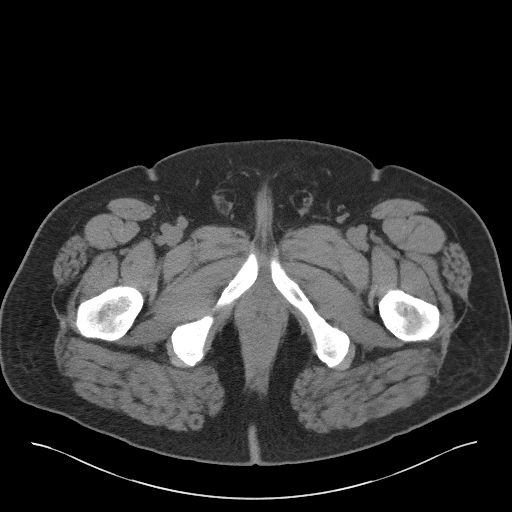
[im 5/109  bone]
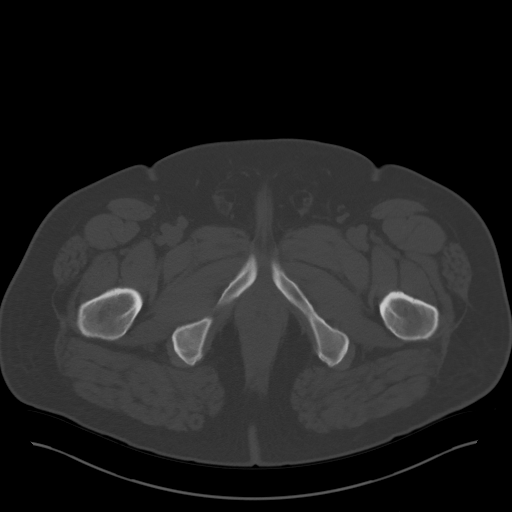
[im 15/109  soft-tissue]
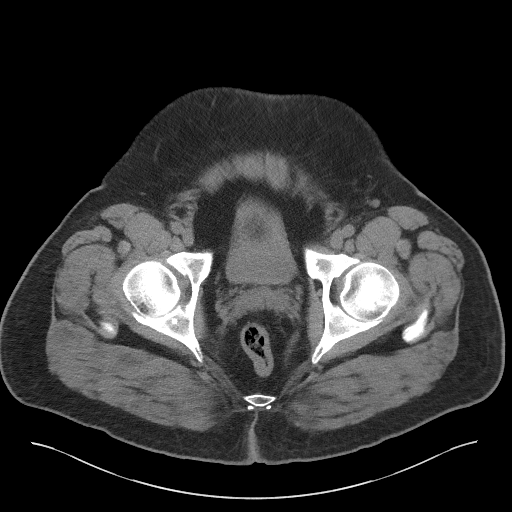
[im 25/109  soft-tissue]
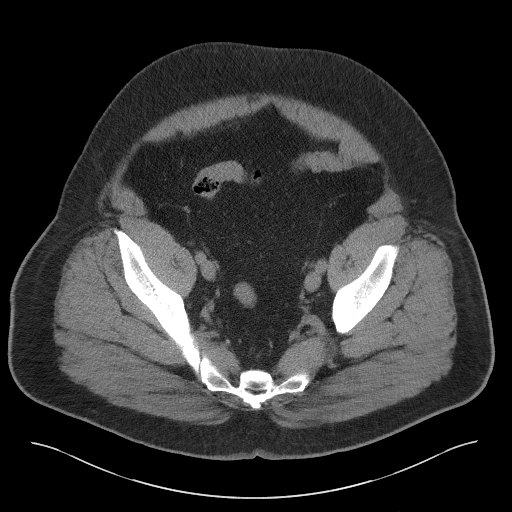
[im 30/109  soft-tissue]
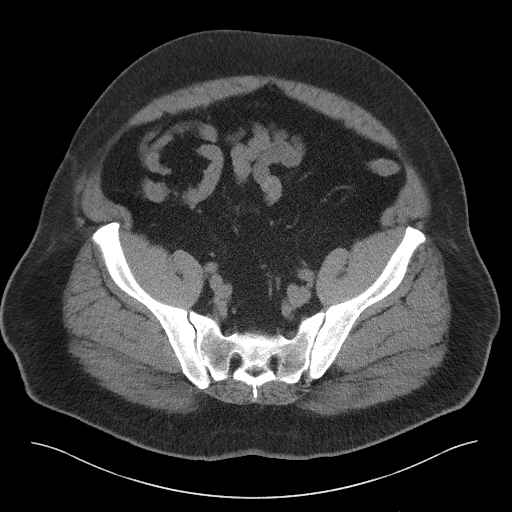
[im 40/109  soft-tissue]
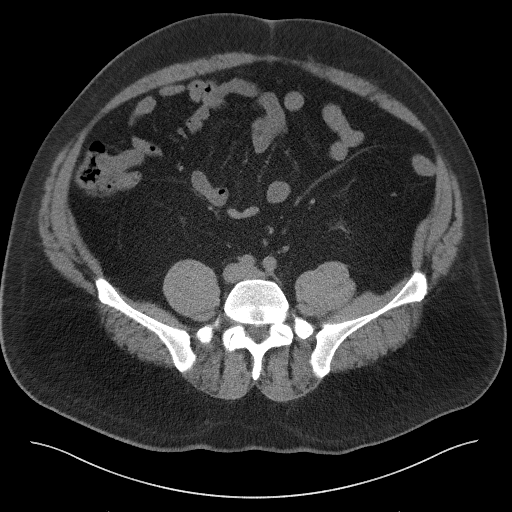
[im 45/109  soft-tissue]
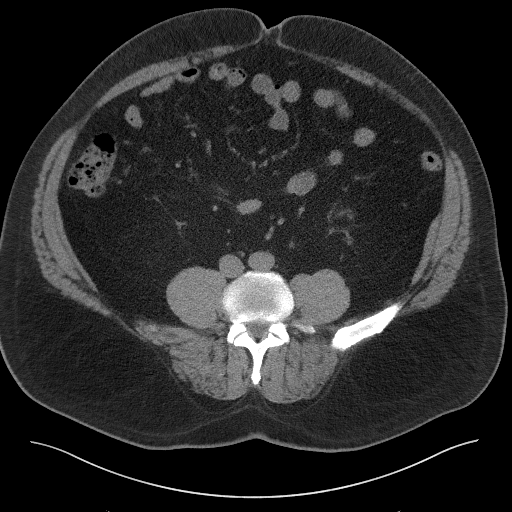
[im 55/109  soft-tissue]
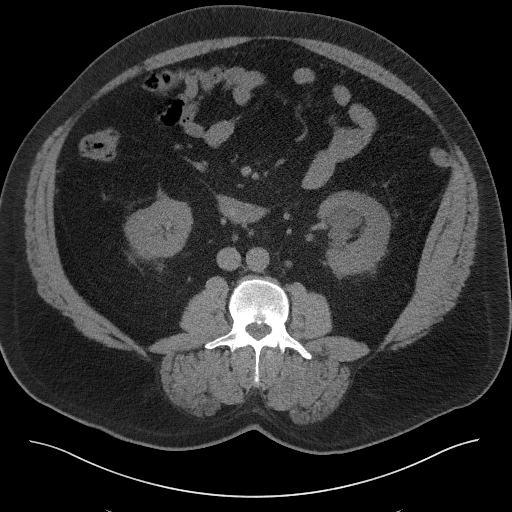
[im 64/109  soft-tissue]
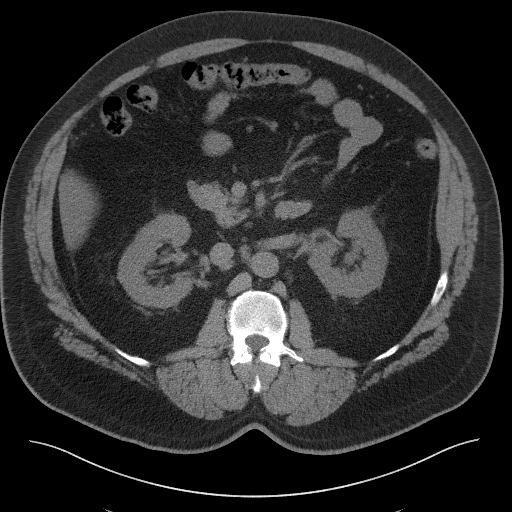
[im 69/109  soft-tissue]
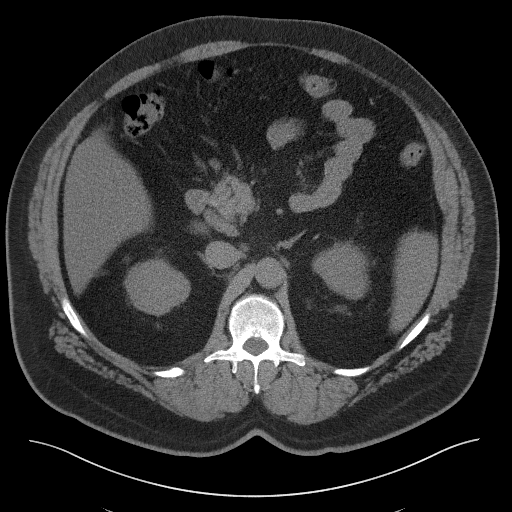
[im 69/109  bone]
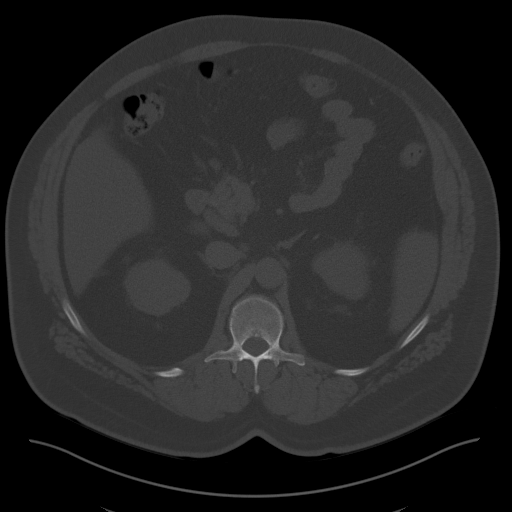
[im 79/109  soft-tissue]
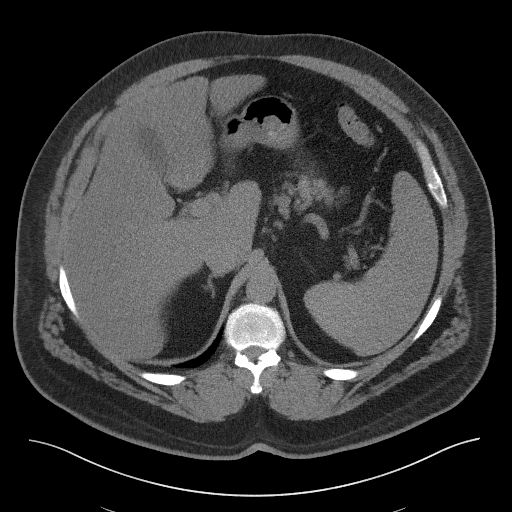
[im 84/109  soft-tissue]
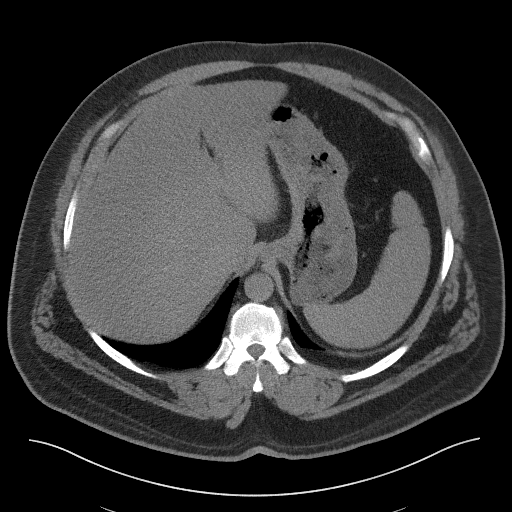
[im 94/109  soft-tissue]
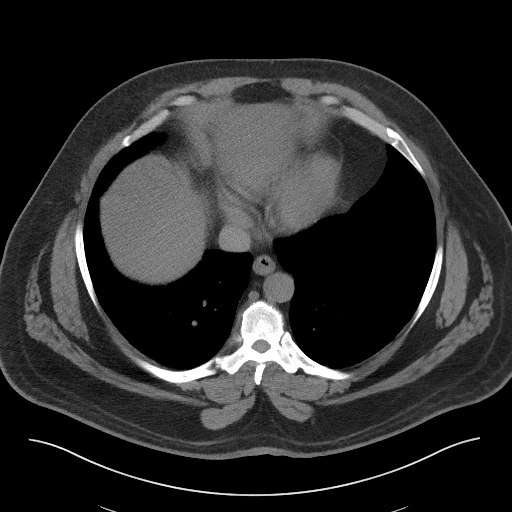
[im 104/109  soft-tissue]
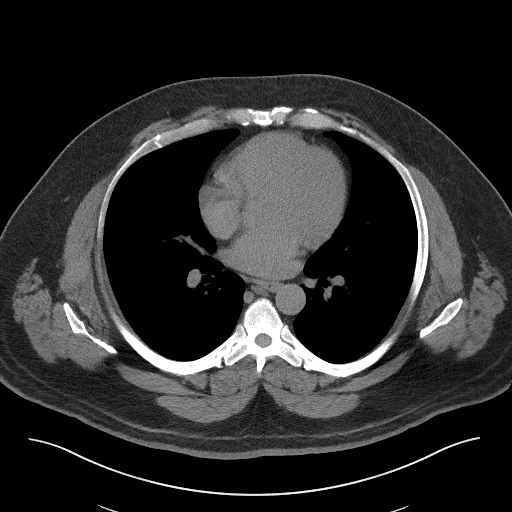

[Series 5: coronal · coronal · 0.91mm/px · 3 of 204 slices shown]
[im 68/204  soft-tissue]
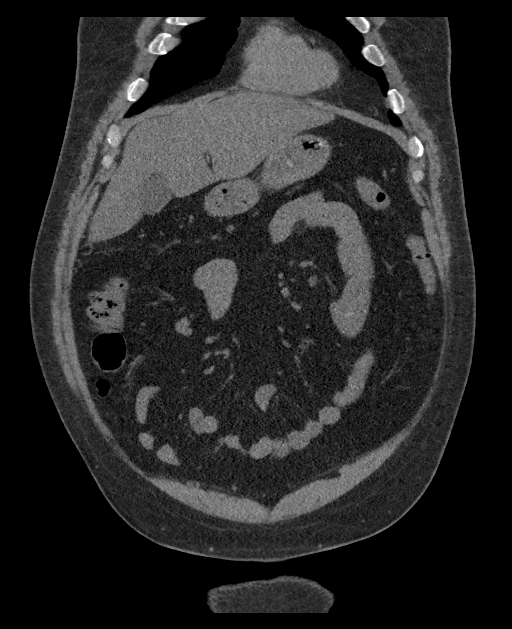
[im 91/204  soft-tissue]
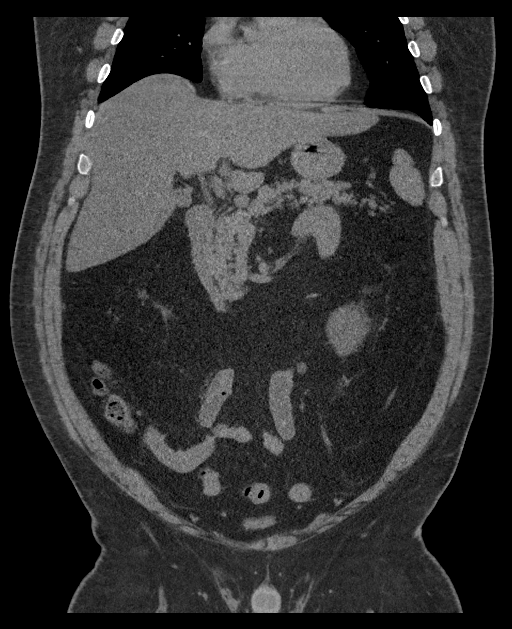
[im 113/204  soft-tissue]
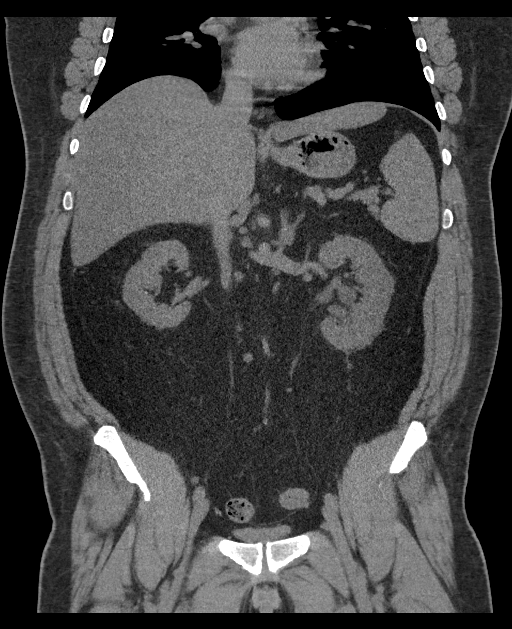

[16 of 46 positions shown; findings below may reference images not displayed]

FINDINGS: Lower chest: No acute abnormality.

Hepatobiliary: There is diffuse low density of the liver consistent
with fatty infiltration. No focal liver lesion is identified. The
gallbladder and biliary tree are normal.

Pancreas: Unremarkable. No pancreatic ductal dilatation or
surrounding inflammatory changes.

Spleen: Unremarkable.

Adrenals/Urinary Tract: There is left hydronephrosis due to
obstruction by a 4.5 mm stone in proximal left ureter. There is a 1
mm nonobstructing stone in the mid to lower pole left kidney. There
is no right hydronephrosis or right renal stone. The adrenal glands
are normal. The bladder is partially decompressed without gross
abnormality.

Stomach/Bowel: Stomach is within normal limits. The patient status
post prior appendectomy. No evidence of bowel wall thickening,
distention, or inflammatory changes.

Vascular/Lymphatic: No significant vascular findings are present. No
enlarged abdominal or pelvic lymph nodes.

Reproductive: Prostate calcifications are identified.

Other: There is no ascites. Minimal umbilical herniation of
mesenteric fat is identified.

Musculoskeletal: Mild degenerative joint changes of the spine are
identified.
IMPRESSION: Left hydroureteronephrosis due to obstruction by 4.5 mm stone in the
proximal left ureter.

Nonobstructing left kidney stone.

Fatty infiltration of liver.

## 2019-07-02 DIAGNOSIS — H50332 Intermittent monocular exotropia, left eye: Secondary | ICD-10-CM | POA: Insufficient documentation

## 2019-07-02 DIAGNOSIS — H5022 Vertical strabismus, left eye: Secondary | ICD-10-CM | POA: Insufficient documentation

## 2019-07-02 DIAGNOSIS — H532 Diplopia: Secondary | ICD-10-CM | POA: Insufficient documentation

## 2020-03-02 ENCOUNTER — Other Ambulatory Visit: Payer: Self-pay

## 2020-03-02 ENCOUNTER — Emergency Department
Admission: EM | Admit: 2020-03-02 | Discharge: 2020-03-02 | Disposition: A | Discharge status: Home / Self Care | Payer: Managed Care, Other (non HMO) | Attending: Emergency Medicine | Admitting: Emergency Medicine

## 2020-03-02 ENCOUNTER — Encounter: Payer: Self-pay | Admitting: Emergency Medicine

## 2020-03-02 ENCOUNTER — Emergency Department: Payer: Managed Care, Other (non HMO)

## 2020-03-02 DIAGNOSIS — I252 Old myocardial infarction: Secondary | ICD-10-CM | POA: Insufficient documentation

## 2020-03-02 DIAGNOSIS — Z7982 Long term (current) use of aspirin: Secondary | ICD-10-CM | POA: Insufficient documentation

## 2020-03-02 DIAGNOSIS — Z79899 Other long term (current) drug therapy: Secondary | ICD-10-CM | POA: Insufficient documentation

## 2020-03-02 DIAGNOSIS — R0789 Other chest pain: Secondary | ICD-10-CM

## 2020-03-02 DIAGNOSIS — E119 Type 2 diabetes mellitus without complications: Secondary | ICD-10-CM | POA: Insufficient documentation

## 2020-03-02 DIAGNOSIS — R1012 Left upper quadrant pain: Secondary | ICD-10-CM | POA: Diagnosis present

## 2020-03-02 DIAGNOSIS — I2510 Atherosclerotic heart disease of native coronary artery without angina pectoris: Secondary | ICD-10-CM | POA: Insufficient documentation

## 2020-03-02 DIAGNOSIS — Z720 Tobacco use: Secondary | ICD-10-CM | POA: Insufficient documentation

## 2020-03-02 LAB — HEPATIC FUNCTION PANEL
ALT: 54 U/L — ABNORMAL HIGH (ref 0–44)
AST: 27 U/L (ref 15–41)
Albumin: 4.5 g/dL (ref 3.5–5.0)
Alkaline Phosphatase: 64 U/L (ref 38–126)
Bilirubin, Direct: 0.3 mg/dL — ABNORMAL HIGH (ref 0.0–0.2)
Indirect Bilirubin: 0.8 mg/dL (ref 0.3–0.9)
Total Bilirubin: 1.1 mg/dL (ref 0.3–1.2)
Total Protein: 7.6 g/dL (ref 6.5–8.1)

## 2020-03-02 LAB — BASIC METABOLIC PANEL
Anion gap: 7 (ref 5–15)
BUN: 19 mg/dL (ref 6–20)
CO2: 24 mmol/L (ref 22–32)
Calcium: 8.8 mg/dL — ABNORMAL LOW (ref 8.9–10.3)
Chloride: 106 mmol/L (ref 98–111)
Creatinine, Ser: 0.84 mg/dL (ref 0.61–1.24)
GFR calc Af Amer: >60 mL/min (ref >60)
GFR calc non Af Amer: >60 mL/min (ref >60)
Glucose, Bld: 229 mg/dL — ABNORMAL HIGH (ref 70–99)
Potassium: 4.4 mmol/L (ref 3.5–5.1)
Sodium: 137 mmol/L (ref 135–145)

## 2020-03-02 LAB — LIPASE, BLOOD: Lipase: 40 U/L (ref 11–51)

## 2020-03-02 LAB — TROPONIN I (HIGH SENSITIVITY)
Troponin I (High Sensitivity): 3 ng/L (ref <18)
Troponin I (High Sensitivity): <2 ng/L (ref <18)

## 2020-03-02 LAB — CBC
HCT: 46.7 % (ref 39.0–52.0)
Hemoglobin: 15.7 g/dL (ref 13.0–17.0)
MCH: 28.4 pg (ref 26.0–34.0)
MCHC: 33.6 g/dL (ref 30.0–36.0)
MCV: 84.4 fL (ref 80.0–100.0)
Platelets: 201 10*3/uL (ref 150–400)
RBC: 5.53 MIL/uL (ref 4.22–5.81)
RDW: 13.1 % (ref 11.5–15.5)
WBC: 9.3 10*3/uL (ref 4.0–10.5)
nRBC: 0 % (ref 0.0–0.2)

## 2020-03-02 MED ORDER — SODIUM CHLORIDE 0.9% FLUSH: 3.0000 mL | Freq: Once | INTRAVENOUS | Status: DC

## 2020-03-02 MED ORDER — DICYCLOMINE HCL 10 MG PO CAPS: 10.0000 mg | ORAL_CAPSULE | Freq: Four times a day (QID) | ORAL | 0 refills | Status: AC

## 2020-03-02 NOTE — ED Triage Notes (Signed)
C/O chest discomfort, onset last night.  States birthday was Friday and he had been over eating this weekend.  Had one episode of pink tinged emesis this morning.  Mid/epigastric pain worsened as it radiated down abdomen, up to left shoulder and neck, and around left upper abdomen toward back.  AAOx3.  Skin warm and dry. No SOB/ DOE,  NAD

## 2020-03-02 NOTE — Discharge Instructions (Signed)
Your work-up was reassuring.  We discussed CT imaging but elected to hold off at this time.  Seems like this is more likely related to your intestines and we are giving you GI follow-up.  He should call them to make AN appointment.  We have prescribed some Bentyl to help with any bowel cramping.  Return to the ER if you develop worsening symptoms, vomiting, any other concerns.

## 2020-03-03 NOTE — ED Provider Notes (Signed)
[facility] Emergency Department Provider Note  ____________________________________________   First MD Initiated Contact with Patient 03/02/20 2211     (approximate)  I have reviewed the triage vital signs and the nursing notes.   HISTORY  Chief Complaint Chest Pain and Abdominal Pain    HPI Lance Decker is a 54 y.o. male with negative cath 2016, prior alcohol abuse but none currently, acid reflux who comes in with abdominal pain.  Pt states his birthday was this weekend and he act very unhealthy over it. He states he normally takes carafate and that helps symptoms but it doesn't seem to be helping this time.  He is having pain after he eats in his LU[place] that can be moderate radiate up into his chest, not better with carafate, only happens after eating.  Denies really any pain at this time. States he has had this previously and was told it was acid reflux.  States it feels similar to that but maybe more severe.  He also reports feeling really gassy and having alternate hard and soft stools. Reporst negative colonscopy.           Past Medical History:  Diagnosis Date  . CAD (coronary artery disease)   . Chest pain 02/2015  . Diabetes mellitus without complication ([place])   . Elevated liver enzymes 04/05/2016  . GERD (gastroesophageal reflux disease)   . H/O alcohol abuse    none since 1992  . History of fall 12/2009  . History of syncope 2002  . Hypogonadism male 08/05/2014  . MI (myocardial infarction) ([place]) 2002  . OSA (obstructive sleep apnea)     Patient Active Problem List   Diagnosis Date Noted  . Intractable nausea and vomiting 04/28/2017  . Intractable pain   . Renal colic on left side   . BMI 40.0-44.9, adult ([place]) 01/25/2017  . Elevated liver enzymes 04/05/2016  . Pain in the chest 03/02/2015  . OSA treated with BiPAP 03/01/2015  . Diabetes mellitus, type 2 ([place]) 03/01/2015  . Diabetes mellitus without complication ([place])  [id number]  . Hypogonadism male 08/05/2014    Past Surgical History:  Procedure Laterality Date  . ANAL FISSURECTOMY    . APPENDECTOMY    . CARDIAC CATHETERIZATION Left 03/02/2015   Procedure: Left Heart Cath and Coronary Angiography;  Surgeon: Yolonda Kida, MD;  Location: [place] CV LAB;  Service: Cardiovascular;  Laterality: Left;  . COLONOSCOPY WITH PROPOFOL N/A 09/19/2018   Procedure: COLONOSCOPY WITH PROPOFOL;  Surgeon: Toledo, Benay Pike, MD;  Location: [facility] ENDOSCOPY;  Service: Gastroenterology;  Laterality: N/A;  . CYSTOSCOPY W/ URETERAL STENT PLACEMENT Left 05/05/2017   Procedure: CYSTOSCOPY WITH STENT REPLACEMENT;  Surgeon: Nickie Retort, MD;  Location: [facility] ORS;  Service: Urology;  Laterality: Left;  . CYSTOSCOPY/URETEROSCOPY/HOLMIUM LASER/STENT PLACEMENT Left 04/28/2017   Procedure: CYSTOSCOPY/URETEROSCOPY/HOLMIUM LASER/STENT PLACEMENT;  Surgeon: Hollice Espy, MD;  Location: [facility] ORS;  Service: Urology;  Laterality: Left;  . PILONIDAL CYST / SINUS EXCISION    . PILONIDAL CYST EXCISION N/A 05/12/2015   Procedure: CYST EXCISION PILONIDAL EXTENSIVE;  Surgeon: Leonie Green, MD;  Location: [facility] ORS;  Service: General;  Laterality: N/A;  . URETEROSCOPY WITH HOLMIUM LASER LITHOTRIPSY Left 05/05/2017   Procedure: URETEROSCOPY WITH HOLMIUM LASER LITHOTRIPSY;  Surgeon: Nickie Retort, MD;  Location: [facility] ORS;  Service: Urology;  Laterality: Left;    Prior to Admission medications   Medication Sig Start Date End Date Taking? Authorizing Provider  albuterol (PROVENTIL HFA;VENTOLIN HFA)  108 (90 Base) MCG/ACT inhaler Inhale 2 puffs into the lungs every 6 (six) hours as needed for wheezing or shortness of breath. 10/12/17   Arta Silence, MD  aspirin 81 MG tablet Take 81 mg by mouth daily.    [provider]  dicyclomine (BENTYL) 10 MG capsule Take 1 capsule (10 mg total) by mouth 4 (four) times daily for 14 days. 03/02/20 03/16/20  Vanessa [place] Dell, MD    Fluticasone-Salmeterol (ADVAIR DISKUS) 250-50 MCG/DOSE AEPB Inhale 1 puff into the lungs 2 (two) times daily for 7 days. Patient not taking: Reported on 02/03/2018 10/12/17 10/19/17  Arta Silence, MD  ibuprofen (ADVIL,MOTRIN) 600 MG tablet Take 1 tablet (600 mg total) by mouth every 6 (six) hours as needed. 10/12/17   Arta Silence, MD  metFORMIN (GLUCOPHAGE) 500 MG tablet Take 500 mg by mouth 2 (two) times daily.     [provider]  Multiple Vitamin (MULTIVITAMIN) tablet Take 1 tablet by mouth daily.    [provider]  Omega-3 Fatty Acids (OMEGA 3 PO) Take by mouth daily.    [provider]  omeprazole (PRILOSEC) 20 MG capsule Take 1 capsule (20 mg total) by mouth 2 (two) times daily before a meal. X 7 days Patient taking differently: Take 20 mg by mouth daily.  03/02/15   Tama High III, MD  RIBOFLAVIN PO Take by mouth daily.    [provider]  sucralfate (CARAFATE) 1 g tablet Take 1 tablet (1 g total) by mouth 4 (four) times daily. 02/03/18   Carrie Mew, MD    Allergies Penicillins  Family History  Problem Relation Age of Onset  . Heart disease Father   . Diabetes Mother   . Diabetes Maternal Grandfather   . Diabetes Maternal Grandmother   . Prostate cancer Neg Hx   . Kidney cancer Neg Hx     Social History Social History   Tobacco Use  . Smoking status: Never Smoker  . Smokeless tobacco: Current User    Types: Snuff  . Tobacco comment: Dips one can per day  Substance Use Topics  . Alcohol use: No    Comment: History of alcohol abuse, none since 1992  . Drug use: No      Review of Systems Constitutional: No fever/chills Eyes: No visual changes. ENT: No sore throat. Cardiovascular: Positive chest pain Respiratory: Denies shortness of breath. Gastrointestinal:+ abd pain, no vomiting, changes in stool Genitourinary: Negative for dysuria. Musculoskeletal: Negative for back pain. Skin: Negative for  rash. Neurological: Negative for headaches, focal weakness or numbness. All other ROS negative ____________________________________________   PHYSICAL EXAM:  VITAL SIGNS: ED Triage Vitals  Enc Vitals Group     BP 03/02/20 1848 135/75     Pulse Rate 03/02/20 1848 78     Resp 03/02/20 1848 18     Temp 03/02/20 1848 98.2 F (36.8 C)     Temp Source 03/02/20 1848 Oral     SpO2 03/02/20 1848 96 %     Weight 03/02/20 1849 (!) 318 lb (144.2 kg)     Height 03/02/20 1849 6' (1.829 m)     Head Circumference --      Peak Flow --      Pain Score 03/02/20 1853 8     Pain Loc --      Pain Edu? --      Excl. in [place]? --     Constitutional: Alert and oriented. Well appearing and in no acute distress. Eyes:  Conjunctivae are normal. EOMI. Head: Atraumatic. Nose: No congestion/rhinnorhea. Mouth/Throat: Mucous membranes are moist.   Neck: No stridor. Trachea Midline. FROM Cardiovascular: Normal rate, regular rhythm. Grossly normal heart sounds.  Good peripheral circulation. Respiratory: Normal respiratory effort.  No retractions. Lungs CTAB. Gastrointestinal: Soft and nontender. No distention. No abdominal bruits.  Musculoskeletal: No lower extremity tenderness nor edema.  No joint effusions. Neurologic:  Normal speech and language. No gross focal neurologic deficits are appreciated.  Skin:  Skin is warm, dry and intact. No rash noted. Psychiatric: Mood and affect are normal. Speech and behavior are normal. GU: Deferred   ____________________________________________   LABS (all labs ordered are listed, but only abnormal results are displayed)  Labs Reviewed  BASIC METABOLIC PANEL - Abnormal; Notable for the following components:      Result Value   Glucose, Bld 229 (*)    Calcium 8.8 (*)    All other components within normal limits  HEPATIC FUNCTION PANEL - Abnormal; Notable for the following components:   ALT 54 (*)    Bilirubin, Direct 0.3 (*)    All other components within  normal limits  CBC  LIPASE, BLOOD  TROPONIN I (HIGH SENSITIVITY)  TROPONIN I (HIGH SENSITIVITY)   ____________________________________________   ED ECG REPORT I, Vanessa Willacy, the attending physician, personally viewed and interpreted this ECG.  Normal sinus, no st elevation, no twi, normal intervals ____________________________________________  RADIOLOGY Robert Bellow, personally viewed and evaluated these images (plain radiographs) as part of my medical decision making, as well as reviewing the written report by the radiologist.  ED MD interpretation:  No pna  Official radiology report(s): DG Chest 2 View  Result Date: 03/02/2020 CLINICAL DATA:  Left chest pain EXAM: CHEST - 2 VIEW COMPARISON:  02/03/2018 FINDINGS: The heart size and mediastinal contours are within normal limits. Both lungs are clear. The visualized skeletal structures are unremarkable. IMPRESSION: No acute abnormality of the lungs. Electronically Signed   By: Eddie Candle M.D.   On: 03/02/2020 19:21    ____________________________________________   PROCEDURES  Procedure(s) performed (including Critical Care):  Procedures   ____________________________________________   INITIAL IMPRESSION / ASSESSMENT AND PLAN / ED COURSE   Lance Decker was evaluated in Emergency Department on 03/03/2020 for the symptoms described in the history of present illness. He was evaluated in the context of the global COVID-19 pandemic, which necessitated consideration that the patient might be at risk for infection with the SARS-CoV-2 virus that causes COVID-19. Institutional protocols and algorithms that pertain to the evaluation of patients at risk for COVID-19 are in a state of rapid change based on information released by regulatory bodies including the CDC and federal and state organizations. These policies and algorithms were followed during the patient's care in the ED.    Most Likely DDx:  -MSK (atypical chest  pain) but will get cardiac markers to evaluate for ACS given risk factors/age -abd soft and non tender discussed CT imaging but wanted to hold off which is resonable given not tender at this time. Low suspicion for SBO, appendicitis, other acute pathology.  DDx that was also considered d/t potential to cause harm, but was found less likely based on history and physical (as detailed above): -PNA (no fevers, cough but CXR to evaluate) -PNX (reassured with equal b/l breath sounds, CXR to evaluate) -Symptomatic anemia (will get H&H) -Pulmonary embolism as no sob at rest, not pleuritic in nature, no hypoxia -Aortic Dissection as no tearing pain and  no radiation to the mid back, pulses equal -Pericarditis no rub on exam, EKG changes or hx to suggest dx -Tamponade (no notable SOB, tachycardic, hypotensive) -Esophageal rupture (no h/o diffuse vomitting/no crepitus)  trops negative x2  ALT slightly elevated, no RUQ pain. Prior CT no gallstones doubt cholecysitis.   D/w pt possible IBS/Acid reflux. Will trial bentyl and give GI followup and encouraged going back to old healthy diet..  Pt felt comfortable with this plan.   I discussed the provisional nature of ED diagnosis, the treatment so far, the ongoing plan of care, follow up appointments and return precautions with the patient and any family or support people present. They expressed understanding and agreed with the plan, discharged home.           ____________________________________________   FINAL CLINICAL IMPRESSION(S) / ED DIAGNOSES   Final diagnoses:  Left upper quadrant abdominal pain  Atypical chest pain     MEDICATIONS GIVEN DURING THIS VISIT:  Medications - No data to display   ED Discharge Orders         Ordered    dicyclomine (BENTYL) 10 MG capsule  4 times daily     03/02/20 2313           Note:  This document was prepared using Dragon voice recognition software and may include unintentional dictation  errors.   Vanessa [place], MD 03/03/20 [phone]

## 2020-04-13 ENCOUNTER — Encounter: Payer: Self-pay | Admitting: *Deleted

## 2020-04-13 ENCOUNTER — Ambulatory Visit: Payer: Managed Care, Other (non HMO) | Admitting: Gastroenterology

## 2020-04-13 ENCOUNTER — Other Ambulatory Visit: Payer: Self-pay

## 2024-02-19 ENCOUNTER — Other Ambulatory Visit: Payer: Self-pay | Admitting: Internal Medicine

## 2024-02-19 DIAGNOSIS — R1011 Right upper quadrant pain: Secondary | ICD-10-CM

## 2024-02-29 ENCOUNTER — Ambulatory Visit

## 2024-04-09 ENCOUNTER — Encounter: Payer: Self-pay | Admitting: Internal Medicine

## 2024-04-24 ENCOUNTER — Encounter: Payer: Self-pay | Admitting: Internal Medicine

## 2024-05-01 ENCOUNTER — Ambulatory Visit: Admitting: Certified Registered"

## 2024-05-01 ENCOUNTER — Encounter
Admission: RE | Disposition: A | Discharge status: Home / Self Care | Payer: Self-pay | Source: Home / Self Care | Attending: Internal Medicine

## 2024-05-01 ENCOUNTER — Encounter: Payer: Self-pay | Admitting: Internal Medicine

## 2024-05-01 ENCOUNTER — Ambulatory Visit
Admission: RE | Admit: 2024-05-01 | Discharge: 2024-05-01 | Disposition: A | Discharge status: Home / Self Care | Payer: Managed Care, Other (non HMO) | Attending: Internal Medicine | Admitting: Internal Medicine

## 2024-05-01 DIAGNOSIS — G4733 Obstructive sleep apnea (adult) (pediatric): Secondary | ICD-10-CM | POA: Insufficient documentation

## 2024-05-01 DIAGNOSIS — R112 Nausea with vomiting, unspecified: Secondary | ICD-10-CM | POA: Diagnosis not present

## 2024-05-01 DIAGNOSIS — Z7952 Long term (current) use of systemic steroids: Secondary | ICD-10-CM | POA: Insufficient documentation

## 2024-05-01 DIAGNOSIS — I2510 Atherosclerotic heart disease of native coronary artery without angina pectoris: Secondary | ICD-10-CM | POA: Diagnosis not present

## 2024-05-01 DIAGNOSIS — Z1211 Encounter for screening for malignant neoplasm of colon: Secondary | ICD-10-CM | POA: Diagnosis present

## 2024-05-01 DIAGNOSIS — Z7984 Long term (current) use of oral hypoglycemic drugs: Secondary | ICD-10-CM | POA: Diagnosis not present

## 2024-05-01 DIAGNOSIS — R1013 Epigastric pain: Secondary | ICD-10-CM | POA: Diagnosis not present

## 2024-05-01 DIAGNOSIS — K2970 Gastritis, unspecified, without bleeding: Secondary | ICD-10-CM | POA: Diagnosis not present

## 2024-05-01 DIAGNOSIS — K3189 Other diseases of stomach and duodenum: Secondary | ICD-10-CM | POA: Diagnosis not present

## 2024-05-01 DIAGNOSIS — K654 Sclerosing mesenteritis: Secondary | ICD-10-CM | POA: Insufficient documentation

## 2024-05-01 DIAGNOSIS — K529 Noninfective gastroenteritis and colitis, unspecified: Secondary | ICD-10-CM | POA: Insufficient documentation

## 2024-05-01 DIAGNOSIS — K2100 Gastro-esophageal reflux disease with esophagitis, without bleeding: Secondary | ICD-10-CM | POA: Insufficient documentation

## 2024-05-01 DIAGNOSIS — K621 Rectal polyp: Secondary | ICD-10-CM | POA: Diagnosis not present

## 2024-05-01 DIAGNOSIS — Z79899 Other long term (current) drug therapy: Secondary | ICD-10-CM | POA: Insufficient documentation

## 2024-05-01 DIAGNOSIS — R1011 Right upper quadrant pain: Secondary | ICD-10-CM | POA: Diagnosis not present

## 2024-05-01 DIAGNOSIS — Z791 Long term (current) use of non-steroidal anti-inflammatories (NSAID): Secondary | ICD-10-CM | POA: Diagnosis not present

## 2024-05-01 DIAGNOSIS — Z7982 Long term (current) use of aspirin: Secondary | ICD-10-CM | POA: Diagnosis not present

## 2024-05-01 DIAGNOSIS — Z7951 Long term (current) use of inhaled steroids: Secondary | ICD-10-CM | POA: Diagnosis not present

## 2024-05-01 DIAGNOSIS — I252 Old myocardial infarction: Secondary | ICD-10-CM | POA: Insufficient documentation

## 2024-05-01 DIAGNOSIS — E119 Type 2 diabetes mellitus without complications: Secondary | ICD-10-CM | POA: Insufficient documentation

## 2024-05-01 HISTORY — PX: ESOPHAGOGASTRODUODENOSCOPY: SHX5428

## 2024-05-01 HISTORY — PX: POLYPECTOMY: SHX149

## 2024-05-01 HISTORY — PX: COLONOSCOPY WITH PROPOFOL: SHX5780

## 2024-05-01 HISTORY — DX: Sclerosing mesenteritis: K65.4

## 2024-05-01 LAB — GLUCOSE, CAPILLARY: Glucose-Capillary: 170 mg/dL — ABNORMAL HIGH (ref 70–99)

## 2024-05-01 SURGERY — COLONOSCOPY WITH PROPOFOL
Anesthesia: General

## 2024-05-01 MED ORDER — LIDOCAINE HCL (CARDIAC) PF 100 MG/5ML IV SOSY
PREFILLED_SYRINGE | INTRAVENOUS | Status: DC | PRN
  Administered 2024-05-01: 100 mg via INTRAVENOUS

## 2024-05-01 MED ORDER — PROPOFOL 1000 MG/100ML IV EMUL
INTRAVENOUS | Status: AC
Start: 2024-05-01 — End: 2024-05-01
  Filled 2024-05-01: qty 800

## 2024-05-01 MED ORDER — PROPOFOL 10 MG/ML IV BOLUS
INTRAVENOUS | Status: DC | PRN
  Administered 2024-05-01: 60 mg via INTRAVENOUS

## 2024-05-01 MED ORDER — GLYCOPYRROLATE 0.2 MG/ML IJ SOLN
INTRAMUSCULAR | Status: DC | PRN
  Administered 2024-05-01: .2 mg via INTRAVENOUS

## 2024-05-01 MED ORDER — SODIUM CHLORIDE 0.9 % IV SOLN: INTRAVENOUS | Status: DC

## 2024-05-01 MED ORDER — MIDAZOLAM HCL 2 MG/2ML IJ SOLN
INTRAMUSCULAR | Status: AC
  Filled 2024-05-01: qty 2

## 2024-05-01 MED ORDER — DEXMEDETOMIDINE HCL IN NACL 200 MCG/50ML IV SOLN
INTRAVENOUS | Status: DC | PRN
  Administered 2024-05-01: 12 ug via INTRAVENOUS

## 2024-05-01 MED ORDER — MIDAZOLAM HCL 2 MG/2ML IJ SOLN
INTRAMUSCULAR | Status: DC | PRN
  Administered 2024-05-01: 2 mg via INTRAVENOUS

## 2024-05-01 MED ORDER — PROPOFOL 500 MG/50ML IV EMUL
INTRAVENOUS | Status: DC | PRN
  Administered 2024-05-01: 155 ug/kg/min via INTRAVENOUS

## 2024-05-01 NOTE — Op Note (Signed)
 [facility] Patient Name: Lance Decker Procedure Date: 05/01/2024 7:15 AM MRN: [id number] Account #: [id number] Date of Birth: Jul 09, 1966 Admit Type: Outpatient Age: 58 Room: [facility] ENDO ROOM 3 Gender: Male Note Status: Finalized Instrument Name: Upper Endoscope [id number] Procedure:             Upper GI endoscopy Indications:           Abdominal pain in the right upper quadrant,                         Gastro-esophageal reflux disease Providers:             Lakenya Riendeau K. Sanjit Mcmichael MD, MD Medicines:             Propofol  per Anesthesia Complications:         No immediate complications. Estimated blood loss:                         Minimal. Procedure:             Pre-Anesthesia Assessment:                        - The risks and benefits of the procedure and the                         sedation options and risks were discussed with the                         patient. All questions were answered and informed                         consent was obtained.                        - Patient identification and proposed procedure were                         verified prior to the procedure by the nurse. The                         procedure was verified in the procedure room.                        - ASA Grade Assessment: III - A patient with severe                         systemic disease.                        - After reviewing the risks and benefits, the patient                         was deemed in satisfactory condition to undergo the                         procedure.                        After obtaining informed consent, the endoscope was  passed under direct vision. Throughout the procedure,                         the patient's blood pressure, pulse, and oxygen                         saturations were monitored continuously. The Endoscope                         was introduced through the mouth, and advanced to the                          third part of duodenum. The upper GI endoscopy was                         accomplished without difficulty. The patient tolerated                         the procedure well. Findings:      The esophagus was normal.      Scattered mild inflammation characterized by congestion (edema) and       erythema was found in the gastric antrum. Biopsies were taken with a       cold forceps for Helicobacter pylori testing. Estimated blood loss was       minimal.      No other significant abnormalities were identified in a careful       examination of the stomach.      The examined duodenum was normal. Impression:            - Normal esophagus.                        - Gastritis. Biopsied.                        - Normal examined duodenum. Recommendation:        - Await pathology results.                        - Proceed with colonoscopy Procedure Code(s):     --- Professional ---                        [phone], Esophagogastroduodenoscopy, flexible,                         transoral; with biopsy, single or multiple Diagnosis Code(s):     --- Professional ---                        K21.9, Gastro-esophageal reflux disease without                         esophagitis                        R10.11, Right upper quadrant pain                        K29.70, Gastritis, unspecified, without bleeding CPT copyright 2022 American Medical Association. All rights reserved. The codes documented in this report are preliminary and upon coder  review may  be revised to meet current compliance requirements. Ladell MARLA Boss MD, MD 05/01/2024 8:44:08 AM This report has been signed electronically. Number of Addenda: 0 Note Initiated On: 05/01/2024 7:15 AM Estimated Blood Loss:  Estimated blood loss was minimal.      [facility]

## 2024-05-01 NOTE — Transfer of Care (Signed)
 Immediate Anesthesia Transfer of Care Note  Patient: Lance Decker  Procedure(s) Performed: COLONOSCOPY WITH PROPOFOL  EGD (ESOPHAGOGASTRODUODENOSCOPY) POLYPECTOMY, INTESTINE  Patient Location: Endoscopy Unit  Anesthesia Type:General  Level of Consciousness: drowsy and patient cooperative  Airway & Oxygen Therapy: Patient Spontanous Breathing and Patient connected to face mask oxygen  Post-op Assessment: Report given to RN and Post -op Vital signs reviewed and stable  Post vital signs: Reviewed and stable  Last Vitals:  Vitals Value Taken Time  BP    Temp    Pulse 78 05/01/24 09:14  Resp 19 05/01/24 09:14  SpO2 94 % 05/01/24 09:14  Vitals shown include unfiled device data.  Last Pain:  Vitals:   05/01/24 0746  TempSrc: Temporal         Complications: No notable events documented.

## 2024-05-01 NOTE — Anesthesia Postprocedure Evaluation (Signed)
 Anesthesia Post Note  Patient: Lance Decker  Procedure(s) Performed: COLONOSCOPY WITH PROPOFOL  EGD (ESOPHAGOGASTRODUODENOSCOPY) POLYPECTOMY, INTESTINE  Patient location during evaluation: Endoscopy Anesthesia Type: General Level of consciousness: awake and alert Pain management: pain level controlled Vital Signs Assessment: post-procedure vital signs reviewed and stable Respiratory status: spontaneous breathing, nonlabored ventilation, respiratory function stable and patient connected to nasal cannula oxygen Cardiovascular status: blood pressure returned to baseline and stable Postop Assessment: no apparent nausea or vomiting Anesthetic complications: no   No notable events documented.   Last Vitals:  Vitals:   05/01/24 0912 05/01/24 0935  BP: 126/81 125/77  Pulse:    Resp:    Temp:    SpO2:      Last Pain:  Vitals:   05/01/24 0925  TempSrc:   PainSc: 0-No pain                 Prentice Murphy

## 2024-05-01 NOTE — Anesthesia Procedure Notes (Signed)
 Procedure Name: General with mask airway Date/Time: 05/01/2024 8:32 AM  Performed by: Ledora Duncan, CRNAPre-anesthesia Checklist: Patient identified, Emergency Drugs available, Suction available and Patient being monitored Patient Re-evaluated:Patient Re-evaluated prior to induction Oxygen Delivery Method: Supernova nasal CPAP and Circle system utilized Airway Equipment and Method: Oral airway Placement Confirmation: positive ETCO2 and breath sounds checked- equal and bilateral Dental Injury: Teeth and Oropharynx as per pre-operative assessment

## 2024-05-01 NOTE — Anesthesia Preprocedure Evaluation (Signed)
 Anesthesia Evaluation  Patient identified by MRN, date of birth, ID band Patient awake    Reviewed: Allergy & Precautions, H&P , NPO status , Patient's Chart, lab work & pertinent test results, reviewed documented beta blocker date and time   History of Anesthesia Complications Negative for: history of anesthetic complications  Airway Mallampati: IV  TM Distance: >3 FB Neck ROM: full    Dental  (+) Dental Advidsory Given, Caps, Teeth Intact, Missing   Pulmonary neg shortness of breath, sleep apnea and Continuous Positive Airway Pressure Ventilation , neg COPD, neg recent URI          Cardiovascular Exercise Tolerance: Good (-) hypertension(-) angina + CAD and + Past MI  (-) Cardiac Stents and (-) CABG (-) dysrhythmias (-) Valvular Problems/Murmurs     Neuro/Psych negative neurological ROS  negative psych ROS   GI/Hepatic Neg liver ROS,GERD  ,,  Endo/Other  diabetes  Class 3 obesity  Renal/GU negative Renal ROS  negative genitourinary   Musculoskeletal   Abdominal   Peds  Hematology negative hematology ROS (+)   Anesthesia Other Findings Past Medical History: No date: CAD (coronary artery disease) No date: Diabetes mellitus without complication ([facility]) No date: GERD (gastroesophageal reflux disease) No date: H/O alcohol abuse     Comment:  none since 1992 2002: MI (myocardial infarction) ([facility]) No date: OSA (obstructive sleep apnea)   Reproductive/Obstetrics negative OB ROS                              Anesthesia Physical Anesthesia Plan  ASA: III  Anesthesia Plan: General   Post-op Pain Management:    Induction: Intravenous  PONV Risk Score and Plan: 2 and Propofol  infusion, TIVA and Treatment may vary due to age or medical condition  Airway Management Planned: Natural Airway, Nasal Cannula and Nasal CPAP  Additional Equipment:   Intra-op Plan:   Post-operative Plan:    Informed Consent: I have reviewed the patients History and Physical, chart, labs and discussed the procedure including the risks, benefits and alternatives for the proposed anesthesia with the patient or authorized representative who has indicated his/her understanding and acceptance.     Dental Advisory Given  Plan Discussed with: Anesthesiologist, CRNA and Surgeon  Anesthesia Plan Comments:          Anesthesia Quick Evaluation

## 2024-05-01 NOTE — Op Note (Signed)
 [facility] Gastroenterology Patient Name: Lance Decker Procedure Date: 05/01/2024 7:15 AM MRN: [id number] Account #: [id number] Date of Birth: 19-Feb-1966 Admit Type: Outpatient Age: 58 Room: [facility] ENDO ROOM 3 Gender: Male Note Status: Finalized Instrument Name: Arvis [id number] Procedure:             Colonoscopy Indications:           High risk colon cancer surveillance: Personal history                         of non-advanced adenoma Providers:             Reeva Davern K. Saadiq Poche MD, MD Medicines:             Propofol  per Anesthesia Complications:         No immediate complications. Estimated blood loss:                         Minimal. Procedure:             Pre-Anesthesia Assessment:                        - The risks and benefits of the procedure and the                         sedation options and risks were discussed with the                         patient. All questions were answered and informed                         consent was obtained.                        - Patient identification and proposed procedure were                         verified prior to the procedure by the nurse. The                         procedure was verified in the procedure room.                        - ASA Grade Assessment: III - A patient with severe                         systemic disease.                        - After reviewing the risks and benefits, the patient                         was deemed in satisfactory condition to undergo the                         procedure.                        After obtaining informed consent, the colonoscope was  passed under direct vision. Throughout the procedure,                         the patient's blood pressure, pulse, and oxygen                         saturations were monitored continuously. The                         Colonoscope was introduced through the anus and                         advanced to  the the cecum, identified by appendiceal                         orifice and ileocecal valve. The colonoscopy was                         performed without difficulty. The patient tolerated                         the procedure well. The quality of the bowel                         preparation was adequate. The ileocecal valve,                         appendiceal orifice, and rectum were photographed. Findings:      The perianal and digital rectal examinations were normal. Pertinent       negatives include normal sphincter tone and no palpable rectal lesions.      A 3 mm polyp was found in the rectum. The polyp was sessile. The polyp       was removed with a cold biopsy forceps. Resection and retrieval were       complete. Estimated blood loss was minimal.      The exam was otherwise without abnormality on direct and retroflexion       views. Impression:            - One 3 mm polyp in the rectum, removed with a cold                         biopsy forceps. Resected and retrieved.                        - The examination was otherwise normal on direct and                         retroflexion views. Recommendation:        - Await pathology results from EGD, also performed                         today.                        - Patient has a contact number available for                         emergencies. The signs and symptoms of potential  delayed complications were discussed with the patient.                         Return to normal activities tomorrow. Written                         discharge instructions were provided to the patient.                        - Resume previous diet.                        - Continue present medications.                        - Repeat colonoscopy is recommended for surveillance.                         The colonoscopy date will be determined after                         pathology results from today's exam become available                          for review.                        - Follow up with Jonette Primmer, PA-C in the GI office.                         [phone]                        - Return to physician assistant in 1 month.                        - Telephone GI office to schedule appointment.                        - The findings and recommendations were discussed with                         the patient. Procedure Code(s):     --- Professional ---                        [phone], Colonoscopy, flexible; with biopsy, single or                         multiple Diagnosis Code(s):     --- Professional ---                        D12.8, Benign neoplasm of rectum                        Z86.010, Personal history of colonic polyps CPT copyright 2022 [employer]. All rights reserved. The codes documented in this report are preliminary and upon coder review may  be revised to meet current compliance requirements. Ladell MARLA Boss MD, MD 05/01/2024 8:59:52 AM This report has been signed electronically. Number of Addenda: 0 Note Initiated On:  05/01/2024 7:15 AM Scope Withdrawal Time: 0 hours 6 minutes 38 seconds  Total Procedure Duration: 0 hours 9 minutes 33 seconds  Estimated Blood Loss:  Estimated blood loss was minimal.      [facility]

## 2024-05-01 NOTE — H&P (Signed)
 Outpatient short stay form Pre-procedure 05/01/2024 8:26 AM Megin Consalvo K. Aundria, M.D.  Primary Physician: Ophelia Sage, M.D>  Reason for visit:  Sclerosing mesenteritis (CMS/HHS-HCC)  Enteritis  Abdominal pain, RUQ (right upper quadrant)  Nausea and vomiting, unspecified vomiting type  Dyspepsia  Gastroesophageal reflux disease, unspecified whether esophagitis present Hx adenomatous colon polyps   History of present illness:  Lance Decker presents to the [facility] GI clinic for [facility] ED follow-up of new diagnosis of sclerosing mesenteritis. He was seen and evaluated at [facility] ED 5/14 last week for chief complaint of 2-3 week history of acute RUQ and epigastric abdominal pain with associated nausea, postprandial vomiting, and increased belching. CT abd/pelvis with IV contrast commented on multiple prominent mesenteric lymph nodes and mild mesenteric stranding which could be seen in setting of sclerosing mesenteritis and multiple non-dilated fluid-filled loops of small bowel c/w diagnosis of enteritis. He was referred to General Surgery and started back on PPI and instructed to follow-up with PCP. Dr. Sage placed an urgent referral to GI for further evaluation and management. He was given appointment with me in October 2025 and placed on cancellation list. He was very happy an opening became available this afternoon to be seen. He reports he continues to have significant abdominal pain. He reports intense pressure in his RUQ and epigastrium with a lot of nausea and belching. He has only had one episode of emesis since the ED last week which was a couple minutes after drinking a Diet [facility] can. He reports abdominal pain feels like someone placed a band across his upper abdomen. Fatty foods and salsa seem to cause the abdominal pain to be worse. His PCP thought his symptoms could be 2/2 gallbladder disease so he was set up for gallbladder ultrasound. He has been doing a lot of reading online  about sclerosing mesenteritis and has a lot of questions about management and prognosis of disease. He was instructed by [facility] ED physician to really watch his diet. He reports he was given handout on strict diet on what foods to eat and which foods to avoid. He has no history of bowel obstructions. He denies any fevers, chills, or night sweats. He denies any recent upper respiratory infections. No previous abdominal operations. He has been taking PPI daily along with famotidine  in the evenings. No major changes in his bowels. He denies any hematochezia or melena.  Been taking prednisone prescribed by M. Croley and feels better.   Current Facility-Administered Medications:    0.9 %  sodium chloride  infusion, , Intravenous, Continuous, Meservey, Tamilyn Lupien K, MD, Last Rate: 20 mL/hr at 05/01/24 0756, New Bag at 05/01/24 0756  Medications Prior to Admission  Medication Sig Dispense Refill Last Dose/Taking   JARDIANCE 10 MG TABS tablet Take 10 mg by mouth every morning.   Past Week   predniSONE (DELTASONE) 20 MG tablet Take 40 mg by mouth daily with breakfast.   Taking   albuterol  (PROVENTIL  HFA;VENTOLIN  HFA) 108 (90 Base) MCG/ACT inhaler Inhale 2 puffs into the lungs every 6 (six) hours as needed for wheezing or shortness of breath. 1 Inhaler 2    aspirin  81 MG tablet Take 81 mg by mouth daily.      celecoxib (CELEBREX) 100 MG capsule Take 100 mg by mouth 2 (two) times daily.      dicyclomine  (BENTYL ) 10 MG capsule Take 1 capsule (10 mg total) by mouth 4 (four) times daily for 14 days. 56 capsule 0    Fluticasone -Salmeterol (ADVAIR DISKUS)  250-50 MCG/DOSE AEPB Inhale 1 puff into the lungs 2 (two) times daily for 7 days. (Patient not taking: Reported on 02/03/2018) 14 each 0    ibuprofen  (ADVIL ,MOTRIN ) 600 MG tablet Take 1 tablet (600 mg total) by mouth every 6 (six) hours as needed. 30 tablet 0    metFORMIN (GLUCOPHAGE) 500 MG tablet Take 500 mg by mouth 2 (two) times daily.  (Patient not taking: Reported on  05/01/2024)   Not Taking   metoCLOPramide (REGLAN) 5 MG tablet Take 5 mg by mouth 4 (four) times daily.      Multiple Vitamin (MULTIVITAMIN) tablet Take 1 tablet by mouth daily.      Omega-3 Fatty Acids (OMEGA 3 PO) Take by mouth daily.      omeprazole  (PRILOSEC) 20 MG capsule Take 1 capsule (20 mg total) by mouth 2 (two) times daily before a meal. X 7 days (Patient taking differently: Take 20 mg by mouth daily. ) 14 capsule 0    promethazine  (PHENERGAN ) 12.5 MG tablet Take 12.5 mg by mouth every 6 (six) hours as needed.      RIBOFLAVIN PO Take by mouth daily.      sucralfate  (CARAFATE ) 1 g tablet Take 1 tablet (1 g total) by mouth 4 (four) times daily. 120 tablet 1      Allergies  Allergen Reactions   Penicillins Anaphylaxis    Has patient had a PCN reaction causing immediate rash, facial/tongue/throat swelling, SOB or lightheadedness with hypotension: Yes Has patient had a PCN reaction causing severe rash involving mucus membranes or skin necrosis: No Has patient had a PCN reaction that required hospitalization: Yes Has patient had a PCN reaction occurring within the last 10 years: No If all of the above answers are NO, then may proceed with Cephalosporin use.      Past Medical History:  Diagnosis Date   CAD (coronary artery disease)    Chest pain 02/2015   Diabetes mellitus without complication (HCC)    Elevated liver enzymes 04/05/2016   GERD (gastroesophageal reflux disease)    H/O alcohol abuse    none since 1992   History of fall 12/2009   History of syncope 2002   Hypogonadism male 08/05/2014   Idiopathic sclerosing mesenteritis (HCC)    MI (myocardial infarction) (HCC) 2002   stress induced   OSA (obstructive sleep apnea)     Review of systems:  Otherwise negative.    Physical Exam  Gen: Alert, oriented. Appears stated age.  HEENT: [place]/AT. PERRLA. Lungs: CTA, no wheezes. CV: RR nl S1, S2. Abd: soft, benign, no masses. BS+ Ext: No edema. Pulses  2+    Planned procedures: Proceed with EGD and colonoscopy. The patient understands the nature of the planned procedure, indications, risks, alternatives and potential complications including but not limited to bleeding, infection, perforation, damage to internal organs and possible oversedation/side effects from anesthesia. The patient agrees and gives consent to proceed.  Please refer to procedure notes for findings, recommendations and patient disposition/instructions.     Karnell Vanderloop K. Aundria, M.D. Gastroenterology 05/01/2024  8:26 AM

## 2024-05-01 NOTE — Interval H&P Note (Signed)
 History and Physical Interval Note:  05/01/2024 8:29 AM  Lance Decker  has presented today for surgery, with the diagnosis of Z86.0101 (ICD-10-CM) - Hx of adenomatous polyp of colon R10.11 RUQ Abdominal pain R11.2 N&V R10.13 Dyspepsia K21.9 GERD.  The various methods of treatment have been discussed with the patient and family. After consideration of risks, benefits and other options for treatment, the patient has consented to  Procedure(s) with comments: COLONOSCOPY WITH PROPOFOL  (N/A) - DM EGD (ESOPHAGOGASTRODUODENOSCOPY) (N/A) as a surgical intervention.  The patient's history has been reviewed, patient examined, no change in status, stable for surgery.  I have reviewed the patient's chart and labs.  Questions were answered to the patient's satisfaction.     Scott, Agustine Rossitto

## 2024-05-02 LAB — SURGICAL PATHOLOGY
